# Patient Record
Sex: Male | Born: 1945 | Race: White | Hispanic: No | Marital: Single | Smoking: Former smoker
Health system: Southern US, Community
[De-identification: ages and names within clinical notes are randomized; demographics above are authoritative.]

## PROBLEM LIST (undated history)

## (undated) DIAGNOSIS — E7800 Pure hypercholesterolemia, unspecified: Secondary | ICD-10-CM

## (undated) DIAGNOSIS — I10 Essential (primary) hypertension: Secondary | ICD-10-CM

## (undated) DIAGNOSIS — E119 Type 2 diabetes mellitus without complications: Secondary | ICD-10-CM

## (undated) HISTORY — PX: OTHER SURGICAL HISTORY: SHX169

## (undated) HISTORY — PX: NASAL SINUS SURGERY: SHX719

## (undated) HISTORY — PX: TONSILLECTOMY: SUR1361

## (undated) HISTORY — PX: APPENDECTOMY: SHX54

## (undated) HISTORY — PX: NASAL SEPTUM SURGERY: SHX37

---

## 2017-12-22 ENCOUNTER — Encounter: Payer: Self-pay | Admitting: Gastroenterology

## 2018-01-19 ENCOUNTER — Ambulatory Visit (INDEPENDENT_AMBULATORY_CARE_PROVIDER_SITE_OTHER): Payer: Self-pay

## 2018-01-19 DIAGNOSIS — Z86010 Personal history of colonic polyps: Secondary | ICD-10-CM

## 2018-01-19 MED ORDER — NA SULFATE-K SULFATE-MG SULF 17.5-3.13-1.6 GM/177ML PO SOLN: 1.0000 | ORAL | 0 refills | Status: DC

## 2018-01-19 NOTE — Patient Instructions (Signed)
Henry [name]  Mar 17, 1946 MRN: [id number]     Procedure Date: 02/25/18 Time to register: 7:30am Place to register: [facility] Procedure Time: 8:30 am Scheduled provider: Barney Drain, MD    PREPARATION FOR COLONOSCOPY WITH SUPREP BOWEL PREP KIT  Note: Suprep Bowel Prep Kit is a split-dose (2day) regimen. Consumption of BOTH 6-ounce bottles is required for a complete prep.  Please notify us immediately if you are diabetic, take iron supplements, or if you are on Coumadin or any other blood thinners.  Please hold the following medications: I will mail a letter with this information                                                                                                                                                  1 DAY BEFORE PROCEDURE:  DATE: 02/24/18   DAY: Wednesday Continue clear liquids the entire day - NO SOLID FOOD.   Diabetic medications adjustments for today: see letter  At 6:00pm: Complete steps 1 through 4 below, using ONE (1) 6-ounce bottle, before going to bed. Step 1:  Pour ONE (1) 6-ounce bottle of SUPREP liquid into the mixing container.  Step 2:  Add cool drinking water to the 16 ounce line on the container and mix.  Note: Dilute the solution concentrate as directed prior to use. Step 3:  DRINK ALL the liquid in the container. Step 4:  You MUST drink an additional two (2) or more 16 ounce containers of water over the next one (1) hour.   Continue clear liquids only, until midnight. Do not eat or drink anything after midnight. EXCEPTION: If you take medications for your heart, blood pressure, or breathing, you may take these medications with a small amount of clear liquid.   DAY OF PROCEDURE:   DATE: 02/25/18   DAY: Thursday  Diabetic medications adjustments for today:see letter  5 hours before your procedure at 3:30am: Step 1:  Pour ONE (1) 6-ounce bottle of SUPREP liquid into the mixing container.  Step 2:  Add cool drinking water to the 16  ounce line on the container and mix.  Note: Dilute the solution concentrate as directed prior to use. Step 3:  DRINK ALL the liquid in the container. Step 4:  You MUST drink an additional two (2) or more 16 ounce containers of water over the next one (1) hour. You MUST complete the final glass of water at least 3 hours before your colonoscopy.   Nothing by mouth past 5:30am  You may take your morning medications with sip of water unless we have instructed otherwise.    Please see below for Dietary Information.  CLEAR LIQUIDS INCLUDE:  Water Jello (NOT red in color)   Ice Popsicles (NOT red in color)   Tea (sugar ok, no milk/cream) Powdered fruit flavored drinks  Coffee (sugar ok,  no milk/cream) Gatorade/ Lemonade/ Kool-Aid  (NOT red in color)   Juice: apple, white grape, white cranberry Soft drinks  Clear bullion, consomme, broth (fat free beef/chicken/vegetable)  Carbonated beverages (any kind)  Strained chicken noodle soup Hard Candy   Remember: Clear liquids are liquids that will allow you to see your fingers on the other side of a clear glass. Be sure liquids are NOT red in color, and not cloudy, but CLEAR.  DO NOT EAT OR DRINK ANY OF THE FOLLOWING:  Dairy products of any kind   Cranberry juice Tomato juice / V8 juice   Grapefruit juice Orange juice     Red grape juice  Do not eat any solid foods, including such foods as: cereal, oatmeal, yogurt, fruits, vegetables, creamed soups, eggs, bread, crackers, pureed foods in a blender, etc.   HELPFUL HINTS FOR DRINKING PREP SOLUTION:   Make sure prep is extremely cold. Mix and refrigerate the the morning of the prep. You may also put in the freezer.   You may try mixing some Crystal Light or Country Time Lemonade if you prefer. Mix in small amounts; add more if necessary.  Try drinking through a straw  Rinse mouth with water or a mouthwash between glasses, to remove after-taste.  Try sipping on a cold beverage /ice/ popsicles  between glasses of prep.  Place a piece of sugar-free hard candy in mouth between glasses.  If you become nauseated, try consuming smaller amounts, or stretch out the time between glasses. Stop for 30-60 minutes, then slowly start back drinking.     OTHER INSTRUCTIONS  You will need a responsible adult at least 72 years of age to accompany you and drive you home. This person must remain in the waiting room during your procedure. The hospital will cancel your procedure if you do not have a responsible adult with you.   1. Wear loose fitting clothing that is easily removed. 2. Leave jewelry and other valuables at home.  3. Remove all body piercing jewelry and leave at home. 4. Total time from sign-in until discharge is approximately 2-3 hours. 5. You should go home directly after your procedure and rest. You can resume normal activities the day after your procedure. 6. The day of your procedure you should not:  Drive  Make legal decisions  Operate machinery  Drink alcohol  Return to work   You may call the office (Dept: [phone]) before 5:00pm, or page the doctor on call [phone]) after 5:00pm, for further instructions, if necessary.   Insurance Information YOU WILL NEED TO CHECK WITH YOUR INSURANCE COMPANY FOR THE BENEFITS OF COVERAGE YOU HAVE FOR THIS PROCEDURE.  UNFORTUNATELY, NOT ALL INSURANCE COMPANIES HAVE BENEFITS TO COVER ALL OR PART OF THESE TYPES OF PROCEDURES.  IT IS YOUR RESPONSIBILITY TO CHECK YOUR BENEFITS, HOWEVER, WE WILL BE GLAD TO ASSIST YOU WITH ANY CODES YOUR INSURANCE COMPANY MAY NEED.    PLEASE NOTE THAT MOST INSURANCE COMPANIES WILL NOT COVER A SCREENING COLONOSCOPY FOR PEOPLE UNDER THE AGE OF 50  IF YOU HAVE BCBS INSURANCE, YOU MAY HAVE BENEFITS FOR A SCREENING COLONOSCOPY BUT IF POLYPS ARE FOUND THE DIAGNOSIS WILL CHANGE AND THEN YOU MAY HAVE A DEDUCTIBLE THAT WILL NEED TO BE MET. SO PLEASE MAKE SURE YOU CHECK YOUR BENEFITS FOR A SCREENING  COLONOSCOPY AS WELL AS A DIAGNOSTIC COLONOSCOPY.

## 2018-01-19 NOTE — Progress Notes (Signed)
Gastroenterology Pre-Procedure Review  Request Date:01/19/18 Requesting Physician: Inetta Fermo ( previous tcs 2016 in [place], pt stated he had polyps, Manuela Schwartz will get copy of tcs and path)  PATIENT REVIEW QUESTIONS: The patient responded to the following health history questions as indicated:    1. Diabetes Melitis: yes (glipizide and metformin) 2. Joint replacements in the past 12 months: no 3. Major health problems in the past 3 months: no 4. Has an artificial valve or MVP: no 5. Has a defibrillator: no 6. Has been advised in past to take antibiotics in advance of a procedure like teeth cleaning: no 7. Family history of colon cancer: no  8. Alcohol Use: yes (2-3 beers, 2 times a week ) 9. History of sleep apnea: no  10. History of coronary artery or other vascular stents placed within the last 12 months: no 11. History of any prior anesthesia complications: no    MEDICATIONS & ALLERGIES:    Patient reports the following regarding taking any blood thinners:   Plavix? no Aspirin? no Coumadin? no Brilinta? no Xarelto? no Eliquis? no Pradaxa? no Savaysa? no Effient? no  Patient confirms/reports the following medications:  Current Outpatient Medications  Medication Sig Dispense Refill  . amLODipine-valsartan (EXFORGE) 10-320 MG tablet amlod/valsar tab 10-320mg     . cetirizine (ZYRTEC) 10 MG tablet Take 10 mg by mouth daily.    Marland Kitchen glipiZIDE (GLUCOTROL XL) 10 MG 24 hr tablet glipizide er 10 mg tb24    . hydrochlorothiazide (MICROZIDE) 12.5 MG capsule Take 12.5 mg by mouth daily.    Marland Kitchen levothyroxine (SYNTHROID, LEVOTHROID) 75 MCG tablet levothyroxine sodium 75 mcg tabs    . metFORMIN (GLUCOPHAGE) 500 MG tablet metformin hcl 500 mg tabs    . minocycline (MINOCIN,DYNACIN) 50 MG capsule minocycline hcl 50 mg caps    . pioglitazone (ACTOS) 15 MG tablet pioglitazone hcl 15 mg tabs    . simvastatin (ZOCOR) 40 MG tablet simvastatin 40 mg tabs     No current facility-administered  medications for this visit.     Patient confirms/reports the following allergies:  No Known Allergies  No orders of the defined types were placed in this encounter.   AUTHORIZATION INFORMATION Primary Insurance:[employer] medicare ,  ID #:[id number] Pre-Cert / Auth required: no   SCHEDULE INFORMATION: Procedure has been scheduled as follows:  Date: 02/25/18, Time: 8:30 Location: [facility]  This Gastroenterology Pre-Precedure Review Form is being routed to the following provider(s): Neil Crouch, PA

## 2018-01-22 NOTE — Progress Notes (Signed)
Was patient told to have another in 3 years?

## 2018-01-22 NOTE — Progress Notes (Signed)
Pt said he was told to have another tcs in 3 years.

## 2018-01-26 NOTE — Progress Notes (Signed)
OK to schedule.  Day before TCS: glipizide 1/2 tablet daily, metformin 1/2 tablet bid, actos 1/2 tablet daily Am of TCS: hold glipizide, metformin, actos

## 2018-01-27 NOTE — Progress Notes (Signed)
Letter mailed to the pt with DM instructions 

## 2018-02-22 ENCOUNTER — Telehealth: Payer: Self-pay | Admitting: *Deleted

## 2018-02-22 NOTE — Telephone Encounter (Signed)
LMOVM

## 2018-02-22 NOTE — Telephone Encounter (Signed)
Procedure time for 02/25/18 is now at 10:00am, arrive at 9:00am. Day of procedure 02/25/18: 5 hours prior now changes to 5:00am to drink 2nd half of prep, npo 7:00am.  LMOVM

## 2018-02-23 NOTE — Telephone Encounter (Signed)
Patient aware of the below. He reports he called yesterday and someone had informed him of the information already

## 2018-02-25 ENCOUNTER — Ambulatory Visit (HOSPITAL_COMMUNITY)
Admission: RE | Admit: 2018-02-25 | Discharge: 2018-02-25 | Disposition: A | Discharge status: Home / Self Care | Payer: Medicare Other | Source: Ambulatory Visit | Attending: Gastroenterology | Admitting: Gastroenterology

## 2018-02-25 ENCOUNTER — Other Ambulatory Visit: Payer: Self-pay

## 2018-02-25 ENCOUNTER — Encounter (HOSPITAL_COMMUNITY): Payer: Self-pay | Admitting: *Deleted

## 2018-02-25 ENCOUNTER — Encounter (HOSPITAL_COMMUNITY)
Admission: RE | Disposition: A | Discharge status: Home / Self Care | Payer: Self-pay | Source: Ambulatory Visit | Attending: Gastroenterology

## 2018-02-25 DIAGNOSIS — D122 Benign neoplasm of ascending colon: Secondary | ICD-10-CM | POA: Diagnosis not present

## 2018-02-25 DIAGNOSIS — I10 Essential (primary) hypertension: Secondary | ICD-10-CM | POA: Insufficient documentation

## 2018-02-25 DIAGNOSIS — Z87891 Personal history of nicotine dependence: Secondary | ICD-10-CM | POA: Insufficient documentation

## 2018-02-25 DIAGNOSIS — Z1211 Encounter for screening for malignant neoplasm of colon: Secondary | ICD-10-CM | POA: Diagnosis not present

## 2018-02-25 DIAGNOSIS — D123 Benign neoplasm of transverse colon: Secondary | ICD-10-CM | POA: Diagnosis not present

## 2018-02-25 DIAGNOSIS — E119 Type 2 diabetes mellitus without complications: Secondary | ICD-10-CM | POA: Diagnosis not present

## 2018-02-25 DIAGNOSIS — K644 Residual hemorrhoidal skin tags: Secondary | ICD-10-CM | POA: Diagnosis not present

## 2018-02-25 DIAGNOSIS — Z79890 Hormone replacement therapy (postmenopausal): Secondary | ICD-10-CM | POA: Insufficient documentation

## 2018-02-25 DIAGNOSIS — Z7984 Long term (current) use of oral hypoglycemic drugs: Secondary | ICD-10-CM | POA: Insufficient documentation

## 2018-02-25 DIAGNOSIS — Z79899 Other long term (current) drug therapy: Secondary | ICD-10-CM | POA: Diagnosis not present

## 2018-02-25 DIAGNOSIS — K648 Other hemorrhoids: Secondary | ICD-10-CM | POA: Insufficient documentation

## 2018-02-25 DIAGNOSIS — E7800 Pure hypercholesterolemia, unspecified: Secondary | ICD-10-CM | POA: Diagnosis not present

## 2018-02-25 DIAGNOSIS — Z860100 Personal history of colon polyps, unspecified: Secondary | ICD-10-CM

## 2018-02-25 DIAGNOSIS — K5730 Diverticulosis of large intestine without perforation or abscess without bleeding: Secondary | ICD-10-CM | POA: Insufficient documentation

## 2018-02-25 DIAGNOSIS — D124 Benign neoplasm of descending colon: Secondary | ICD-10-CM | POA: Insufficient documentation

## 2018-02-25 HISTORY — DX: Essential (primary) hypertension: I10

## 2018-02-25 HISTORY — PX: POLYPECTOMY: SHX5525

## 2018-02-25 HISTORY — PX: COLONOSCOPY: SHX5424

## 2018-02-25 HISTORY — DX: Pure hypercholesterolemia, unspecified: E78.00

## 2018-02-25 HISTORY — DX: Type 2 diabetes mellitus without complications: E11.9

## 2018-02-25 LAB — GLUCOSE, CAPILLARY: GLUCOSE-CAPILLARY: 118 mg/dL — AB (ref 65–99)

## 2018-02-25 SURGERY — COLONOSCOPY
Anesthesia: Moderate Sedation

## 2018-02-25 MED ORDER — MIDAZOLAM HCL 5 MG/5ML IJ SOLN
INTRAMUSCULAR | Status: AC
  Filled 2018-02-25: qty 10

## 2018-02-25 MED ORDER — STERILE WATER FOR IRRIGATION IR SOLN
Status: DC | PRN
  Administered 2018-02-25: 11:00:00

## 2018-02-25 MED ORDER — SODIUM CHLORIDE 0.9 % IV SOLN
INTRAVENOUS | Status: DC
  Administered 2018-02-25: 09:00:00 via INTRAVENOUS

## 2018-02-25 MED ORDER — MEPERIDINE HCL 100 MG/ML IJ SOLN
INTRAMUSCULAR | Status: DC | PRN
  Administered 2018-02-25: 25 mg via INTRAVENOUS
  Administered 2018-02-25: 50 mg via INTRAVENOUS

## 2018-02-25 MED ORDER — MIDAZOLAM HCL 5 MG/5ML IJ SOLN
INTRAMUSCULAR | Status: DC | PRN
  Administered 2018-02-25: 1 mg via INTRAVENOUS
  Administered 2018-02-25 (×2): 2 mg via INTRAVENOUS

## 2018-02-25 MED ORDER — MEPERIDINE HCL 100 MG/ML IJ SOLN
INTRAMUSCULAR | Status: AC
  Filled 2018-02-25: qty 2

## 2018-02-25 NOTE — Op Note (Signed)
[facility] Patient Name: Henry [name] Procedure Date: 02/25/2018 8:49 AM MRN: [id number] Date of Birth: 20-Oct-1945 Attending MD: Barney Drain MD, MD CSN: [id number] Age: 72 Admit Type: Ambulatory Procedure:                Colonoscopy WITH COLD SNARE POLYPECTOMY Indications:              Personal history of colonic polyps Providers:                Barney Drain MD, MD, Lurline Del, RN, Randa Spike, Technician Referring MD:             Edwinna Areola. [place] Crane MD Medicines:                Meperidine 75 mg IV, Midazolam 5 mg IV Complications:            No immediate complications. Estimated Blood Loss:     Estimated blood loss was minimal. Procedure:                Pre-Anesthesia Assessment:                           - Prior to the procedure, a History and Physical                            was performed, and patient medications and                            allergies were reviewed. The patient's tolerance of                            previous anesthesia was also reviewed. The risks                            and benefits of the procedure and the sedation                            options and risks were discussed with the patient.                            All questions were answered, and informed consent                            was obtained. Prior Anticoagulants: The patient has                            taken no previous anticoagulant or antiplatelet                            agents. ASA Grade Assessment: II - A patient with                            mild systemic disease. After reviewing the risks  and benefits, the patient was deemed in                            satisfactory condition to undergo the procedure.                            After obtaining informed consent, the colonoscope                            was passed under direct vision. Throughout the                            procedure, the patient's blood pressure,  pulse, and                            oxygen saturations were monitored continuously. The                            EC-[id number] ([id number]) scope was introduced through                            the anus and advanced to the the cecum, identified                            by appendiceal orifice and ileocecal valve. The                            colonoscopy was somewhat difficult due to a                            tortuous colon. Successful completion of the                            procedure was aided by straightening and shortening                            the scope to obtain bowel loop reduction and                            COLOWRAP. The patient tolerated the procedure well.                            The quality of the bowel preparation was excellent.                            The ileocecal valve, appendiceal orifice, and                            rectum were photographed. Scope In: 11:14:18 AM Scope Out: 11:31:03 AM Scope Withdrawal Time: 0 hours 14 minutes 15 seconds  Total Procedure Duration: 0 hours 16 minutes 45 seconds  Findings:      Six sessile polyps were found in the descending colon, hepatic flexure       and ascending colon.  The polyps were 3 to 6 mm in size. These polyps       were removed with a cold snare. Resection and retrieval were complete.      Multiple small and large-mouthed diverticula were found in the       recto-sigmoid colon and sigmoid colon.      External hemorrhoids were found. The hemorrhoids were moderate.      Internal hemorrhoids were found. The hemorrhoids were small.      The recto-sigmoid colon and sigmoid colon were mildly redundant. Impression:               - Six 3 to 6 mm polyps in the descending colon, at                            the hepatic flexure and in the ascending colon,                            removed with a cold snare. Resected and retrieved.                           - MODERATE Diverticulosis in the recto-sigmoid                             colon and in the sigmoid colon.                           - External hemorrhoids.                           - Internal hemorrhoids.                           - Redundant LEFT colon. Moderate Sedation:      Moderate (conscious) sedation was administered by the endoscopy nurse       and supervised by the endoscopist. The following parameters were       monitored: oxygen saturation, heart rate, blood pressure, and response       to care. Total physician intraservice time was 31 minutes. Recommendation:           - High fiber diet.                           - Continue present medications.                           - Await pathology results.                           - Repeat colonoscopy in 3 years for surveillance.                           - Patient has a contact number available for                            emergencies. The signs and symptoms of potential  delayed complications were discussed with the                            patient. Return to normal activities tomorrow.                            Written discharge instructions were provided to the                            patient. Procedure Code(s):        --- Professional ---                           [phone], Colonoscopy, flexible; with removal of                            tumor(s), polyp(s), or other lesion(s) by snare                            technique                           G0500, Moderate sedation services provided by the                            same physician or other qualified health care                            professional performing a gastrointestinal                            endoscopic service that sedation supports,                            requiring the presence of an independent trained                            observer to assist in the monitoring of the                            patient's level of consciousness and physiological                             status; initial 15 minutes of intra-service time;                            patient age 102 years or older (additional time may                            be reported with [phone], as appropriate)                           [id number], Moderate sedation services provided by the  same physician or other qualified health care                            professional performing the diagnostic or                            therapeutic service that the sedation supports,                            requiring the presence of an independent trained                            observer to assist in the monitoring of the                            patient's level of consciousness and physiological                            status; each additional 15 minutes intraservice                            time (List separately in addition to code for                            primary service) Diagnosis Code(s):        --- Professional ---                           D12.4, Benign neoplasm of descending colon                           D12.3, Benign neoplasm of transverse colon (hepatic                            flexure or splenic flexure)                           D12.2, Benign neoplasm of ascending colon                           K64.4, Residual hemorrhoidal skin tags                           K64.8, Other hemorrhoids                           Z86.010, Personal history of colonic polyps                           K57.30, Diverticulosis of large intestine without                            perforation or abscess without bleeding                           Q43.8, Other specified congenital malformations of  intestine CPT copyright 2017 American Medical Association. All rights reserved. The codes documented in this report are preliminary and upon coder review may  be revised to meet current compliance requirements. Barney Drain, MD Barney Drain MD, MD 02/25/2018  11:50:18 AM This report has been signed electronically. Number of Addenda: 0

## 2018-02-25 NOTE — H&P (Signed)
Primary Care Physician:  Celene Squibb, MD Primary Gastroenterologist:  Dr. Oneida Alar  Pre-Procedure History & Physical: HPI:  Henry [name] is a 72 y.o. male here for  [place], [place].  Past Medical History:  Diagnosis Date  . Diabetes mellitus without complication ([place])   . Hypercholesteremia   . Hypertension     Past Surgical History:  Procedure Laterality Date  . APPENDECTOMY    . Left leg surgery    . NASAL SEPTUM SURGERY    . NASAL SINUS SURGERY    . TONSILLECTOMY      Prior to Admission medications   Medication Sig Start Date End Date Taking? Authorizing Provider  cetirizine (ZYRTEC) 10 MG tablet Take 10 mg by mouth daily as needed for allergies.    Yes [provider]  glipiZIDE (GLUCOTROL XL) 10 MG 24 hr tablet Take 10 mg by mouth daily with breakfast.   Yes [provider]  hydrochlorothiazide (MICROZIDE) 12.5 MG capsule Take 12.5 mg by mouth daily.   Yes [provider]  levothyroxine (SYNTHROID, LEVOTHROID) 75 MCG tablet Take 75 mcg by mouth daily before breakfast.   Yes [provider]  metFORMIN (GLUCOPHAGE) 500 MG tablet Take 542m by mouth twice daily   Yes [provider]  minocycline (MINOCIN,DYNACIN) 50 MG capsule Take 576mby mouth daily   Yes [provider]  Multiple Vitamins-Minerals (CENTRUM ADULTS PO) Take 1 tablet by mouth daily.   Yes [provider]  Na Sulfate-K Sulfate-Mg Sulf (SUPREP BOWEL PREP KIT) 17.5-3.13-1.6 GM/177ML SOLN Take 1 kit by mouth as directed. 01/19/18  Yes LeMahala MenghiniPA-C  naproxen sodium (ALEVE) 220 MG tablet Take 440 mg by mouth daily as needed (pain).   Yes [provider]  Omega-3 Fatty Acids (FISH OIL) 1200 MG CAPS Take 2,400 mg by mouth daily.   Yes [provider]  pioglitazone (ACTOS) 15 MG tablet Take 1541my mouth daily   Yes [provider]  simvastatin (ZOCOR) 40 MG tablet Take 33m5m mouth daily    Yes [provider]  telmisartan (MICARDIS) 40 MG tablet Take 40 mg by mouth daily.   Yes [provider]    Allergies as of 01/19/2018  . (No Known Allergies)    Family History  Problem Relation Age of Onset  . Colon cancer Neg Hx   . Colon polyps Neg Hx     Social History   Socioeconomic History  . Marital status: Single    Spouse name: Not on file  . Number of children: Not on file  . Years of education: Not on file  . Highest education level: Not on file  Occupational History  . Not on file  Social Needs  . Financial resource strain: Not on file  . Food insecurity:    Worry: Not on file    Inability: Not on file  . Transportation needs:    Medical: Not on file    Non-medical: Not on file  Tobacco Use  . Smoking status: Former Smoker    Packs/day: 0.25    Years: 4.00    Pack years: 1.00  . Smokeless tobacco: Never Used  Substance and Sexual Activity  . Alcohol use: Yes    Alcohol/week: 7.2 oz    Types: 12 Cans of beer per week  . Drug use: Never  . Sexual activity: Not on file  Lifestyle  . Physical activity:    Days per week: Not on  file    Minutes per session: Not on file  . Stress: Not on file  Relationships  . Social connections:    Talks on phone: Not on file    Gets together: Not on file    Attends religious service: Not on file    Active member of club or organization: Not on file    Attends meetings of clubs or organizations: Not on file    Relationship status: Not on file  . Intimate partner violence:    Fear of current or ex partner: Not on file    Emotionally abused: Not on file    Physically abused: Not on file    Forced sexual activity: Not on file  Other Topics Concern  . Not on file  Social History Narrative  . Not on file    Review of Systems: See HPI, otherwise negative ROS   Physical Exam: BP (!) 164/60   Pulse (!) 55   Temp 97.7 F (36.5 C) (Oral)   Resp 14   Ht 5' 4"  (1.626 m)   Wt 215 lb (97.5 kg)    SpO2 100%   BMI 36.90 kg/m  General:   Alert,  pleasant and cooperative in NAD Head:  Normocephalic and atraumatic. Neck:  Supple; Lungs:  Clear throughout to auscultation.    Heart:  Regular rate and rhythm. Abdomen:  Soft, nontender and nondistended. Normal bowel sounds, without guarding, and without rebound.   Neurologic:  Alert and  oriented x4;  grossly normal neurologically.  Impression/Plan:     PERSONAL HISTORY OF POLYPS.  Plan:  1. TCS TODAY DISCUSSED PROCEDURE, BENEFITS, & RISKS: < 1% chance of medication reaction, bleeding, perforation, or rupture of spleen/liver.

## 2018-02-25 NOTE — Discharge Instructions (Signed)
You have small internal hemorrhoids and diverticulosis IN YOUR LEFT COLON. YOU HAD SIX POLYPS REMOVED.    DRINK WATER TO KEEP YOUR URINE LIGHT YELLOW.  CONTINUE YOUR WEIGHT LOSS EFFORTS. YOUR BODY MASS INDEX IS OVER 30 WHICH MEANS YOU ARE OBESE. OBESITY IS ASSOCIATED WITH AN INCREASED FOR CIRRHOSIS AND ALL CANCERS, INCLUDING ESOPHAGEAL AND COLON CANCER. A WEIGHT OF 173 LBS OR LESS   WILL GET YOUR BODY MASS INDEX(BMI) UNDER 30.  FOLLOW A HIGH FIBER DIET. AVOID ITEMS THAT CAUSE BLOATING. See info below.  YOUR BIOPSY RESULTS WILL BE AVAILABLE IN 7 DAYS.   USE PREPARATION H FOUR TIMES  A DAY IF NEEDED TO RELIEVE RECTAL PAIN/PRESSURE/BLEEDING.  Next colonoscopy in 3 years.  Colonoscopy Care After Read the instructions outlined below and refer to this sheet in the next week. These discharge instructions provide you with general information on caring for yourself after you leave the hospital. While your treatment has been planned according to the most current medical practices available, unavoidable complications occasionally occur. If you have any problems or questions after discharge, call DR. Emerick Weatherly, [phone].  ACTIVITY  You may resume your regular activity, but move at a slower pace for the next 24 hours.   Take frequent rest periods for the next 24 hours.   Walking will help get rid of the air and reduce the bloated feeling in your belly (abdomen).   No driving for 24 hours (because of the medicine (anesthesia) used during the test).   You may shower.   Do not sign any important legal documents or operate any machinery for 24 hours (because of the anesthesia used during the test).    NUTRITION  Drink plenty of fluids.   You may resume your normal diet as instructed by your doctor.   Begin with a light meal and progress to your normal diet. Heavy or fried foods are harder to digest and may make you feel sick to your stomach (nauseated).   Avoid alcoholic beverages for 24  hours or as instructed.    MEDICATIONS  You may resume your normal medications.   WHAT YOU CAN EXPECT TODAY  Some feelings of bloating in the abdomen.   Passage of more gas than usual.   Spotting of blood in your stool or on the toilet paper  .  IF YOU HAD POLYPS REMOVED DURING THE COLONOSCOPY:  Eat a soft diet IF YOU HAVE NAUSEA, BLOATING, ABDOMINAL PAIN, OR VOMITING.    FINDING OUT THE RESULTS OF YOUR TEST Not all test results are available during your visit. DR. Oneida Alar WILL CALL YOU WITHIN 14 DAYS OF YOUR PROCEDUE WITH YOUR RESULTS. Do not assume everything is normal if you have not heard from DR. Pranika Finks, CALL HER OFFICE AT [phone].  SEEK IMMEDIATE MEDICAL ATTENTION AND CALL THE OFFICE: [phone] IF:  You have more than a spotting of blood in your stool.   Your belly is swollen (abdominal distention).   You are nauseated or vomiting.   You have a temperature over 101F.   You have abdominal pain or discomfort that is severe or gets worse throughout the day.  High-Fiber Diet A high-fiber diet changes your normal diet to include more whole grains, legumes, fruits, and vegetables. Changes in the diet involve replacing refined carbohydrates with unrefined foods. The calorie level of the diet is essentially unchanged. The Dietary Reference Intake (recommended amount) for adult males is 38 grams per day. For adult females, it is 25 grams per day. Pregnant  and lactating women should consume 28 grams of fiber per day. Fiber is the intact part of a plant that is not broken down during digestion. Functional fiber is fiber that has been isolated from the plant to provide a beneficial effect in the body.  PURPOSE  Increase stool bulk.   Ease and regulate bowel movements.   Lower cholesterol.   REDUCE RISK OF COLON CANCER  INDICATIONS THAT YOU NEED MORE FIBER  Constipation and hemorrhoids.   Uncomplicated diverticulosis (intestine condition) and irritable bowel  syndrome.   Weight management.   As a protective measure against hardening of the arteries (atherosclerosis), diabetes, and cancer.   GUIDELINES FOR INCREASING FIBER IN THE DIET  Start adding fiber to the diet slowly. A gradual increase of about 5 more grams (2 slices of whole-wheat bread, 2 servings of most fruits or vegetables, or 1 bowl of high-fiber cereal) per day is best. Too rapid an increase in fiber may result in constipation, flatulence, and bloating.   Drink enough water and fluids to keep your urine clear or pale yellow. Water, juice, or caffeine-free drinks are recommended. Not drinking enough fluid may cause constipation.   Eat a variety of high-fiber foods rather than one type of fiber.   Try to increase your intake of fiber through using high-fiber foods rather than fiber pills or supplements that contain small amounts of fiber.   The goal is to change the types of food eaten. Do not supplement your present diet with high-fiber foods, but replace foods in your present diet.   INCLUDE A VARIETY OF FIBER SOURCES  Replace refined and processed grains with whole grains, canned fruits with fresh fruits, and incorporate other fiber sources. White rice, white breads, and most bakery goods contain little or no fiber.   Brown whole-grain rice, buckwheat oats, and many fruits and vegetables are all good sources of fiber. These include: broccoli, Brussels sprouts, cabbage, cauliflower, beets, sweet potatoes, white potatoes (skin on), carrots, tomatoes, eggplant, squash, berries, fresh fruits, and dried fruits.   Cereals appear to be the richest source of fiber. Cereal fiber is found in whole grains and bran. Bran is the fiber-rich outer coat of cereal grain, which is largely removed in refining. In whole-grain cereals, the bran remains. In breakfast cereals, the largest amount of fiber is found in those with "bran" in their names. The fiber content is sometimes indicated on the label.     You may need to include additional fruits and vegetables each day.   In baking, for 1 cup white flour, you may use the following substitutions:   1 cup whole-wheat flour minus 2 tablespoons.   1/2 cup white flour plus 1/2 cup whole-wheat flour.   Polyps, Colon  A polyp is extra tissue that grows inside your body. Colon polyps grow in the large intestine. The large intestine, also called the colon, is part of your digestive system. It is a long, hollow tube at the end of your digestive tract where your body makes and stores stool. Most polyps are not dangerous. They are benign. This means they are not cancerous. But over time, some types of polyps can turn into cancer. Polyps that are smaller than a pea are usually not harmful. But larger polyps could someday become or may already be cancerous. To be safe, doctors remove all polyps and test them.   PREVENTION There is not one sure way to prevent polyps. You might be able to lower your risk of getting them  if you:  Eat more fruits and vegetables and less fatty food.   Do not smoke.   Avoid alcohol.   Exercise every day.   Lose weight if you are overweight.   Eating more calcium and folate can also lower your risk of getting polyps. Some foods that are rich in calcium are milk, cheese, and broccoli. Some foods that are rich in folate are chickpeas, kidney beans, and spinach.    Diverticulosis Diverticulosis is a common condition that develops when small pouches (diverticula) form in the wall of the colon. The risk of diverticulosis increases with age. It happens more often in people who eat a low-fiber diet. Most individuals with diverticulosis have no symptoms. Those individuals with symptoms usually experience belly (abdominal) pain, constipation, or loose stools (diarrhea).  HOME CARE INSTRUCTIONS  Increase the amount of fiber in your diet as directed by your caregiver or dietician. This may reduce symptoms of diverticulosis.    Drink at least 6 to 8 glasses of water each day to prevent constipation.   Try not to strain when you have a bowel movement.   Avoiding nuts and seeds to prevent complications is NOT NECESSARY.   FOODS HAVING HIGH FIBER CONTENT INCLUDE:  Fruits. Apple, peach, pear, tangerine, raisins, prunes.   Vegetables. Brussels sprouts, asparagus, broccoli, cabbage, carrot, cauliflower, romaine lettuce, spinach, summer squash, tomato, winter squash, zucchini.   Starchy Vegetables. Baked beans, kidney beans, lima beans, split peas, lentils, potatoes (with skin).   Grains. Whole wheat bread, brown rice, bran flake cereal, plain oatmeal, white rice, shredded wheat, bran muffins.   SEEK IMMEDIATE MEDICAL CARE IF:  You develop increasing pain or severe bloating.   You have an oral temperature above 101F.   You develop vomiting or bowel movements that are bloody or black.   Hemorrhoids Hemorrhoids are dilated (enlarged) veins around the rectum. Sometimes clots will form in the veins. This makes them swollen and painful. These are called thrombosed hemorrhoids. Causes of hemorrhoids include:  Constipation.   Straining to have a bowel movement.   HEAVY LIFTING   HOME CARE INSTRUCTIONS  Eat a well balanced diet and drink 6 to 8 glasses of water every day to avoid constipation. You may also use a bulk laxative.   Avoid straining to have bowel movements.   Keep anal area dry and clean.   Do not use a donut shaped pillow or sit on the toilet for long periods. This increases blood pooling and pain.   Move your bowels when your body has the urge; this will require less straining and will decrease pain and pressure.

## 2018-03-04 ENCOUNTER — Encounter (HOSPITAL_COMMUNITY): Payer: Self-pay | Admitting: Gastroenterology

## 2018-03-04 NOTE — Progress Notes (Signed)
LMOM to call.

## 2018-03-05 NOTE — Progress Notes (Signed)
CC'D TO PCP °

## 2018-03-11 NOTE — Progress Notes (Signed)
Pt is aware.  

## 2019-11-15 DIAGNOSIS — E039 Hypothyroidism, unspecified: Secondary | ICD-10-CM | POA: Diagnosis not present

## 2019-11-15 DIAGNOSIS — Z6839 Body mass index (BMI) 39.0-39.9, adult: Secondary | ICD-10-CM | POA: Diagnosis not present

## 2019-11-15 DIAGNOSIS — Z6841 Body Mass Index (BMI) 40.0 and over, adult: Secondary | ICD-10-CM | POA: Diagnosis not present

## 2019-11-15 DIAGNOSIS — E1165 Type 2 diabetes mellitus with hyperglycemia: Secondary | ICD-10-CM | POA: Diagnosis not present

## 2019-11-15 DIAGNOSIS — Z0000 Encounter for general adult medical examination without abnormal findings: Secondary | ICD-10-CM | POA: Diagnosis not present

## 2019-11-15 DIAGNOSIS — J060 Acute laryngopharyngitis: Secondary | ICD-10-CM | POA: Diagnosis not present

## 2019-11-15 DIAGNOSIS — E782 Mixed hyperlipidemia: Secondary | ICD-10-CM | POA: Diagnosis not present

## 2019-11-15 DIAGNOSIS — J302 Other seasonal allergic rhinitis: Secondary | ICD-10-CM | POA: Diagnosis not present

## 2019-11-15 DIAGNOSIS — I10 Essential (primary) hypertension: Secondary | ICD-10-CM | POA: Diagnosis not present

## 2019-11-17 DIAGNOSIS — Z0001 Encounter for general adult medical examination with abnormal findings: Secondary | ICD-10-CM | POA: Diagnosis not present

## 2019-11-17 DIAGNOSIS — E871 Hypo-osmolality and hyponatremia: Secondary | ICD-10-CM | POA: Diagnosis not present

## 2019-11-17 DIAGNOSIS — I10 Essential (primary) hypertension: Secondary | ICD-10-CM | POA: Diagnosis not present

## 2019-11-17 DIAGNOSIS — E782 Mixed hyperlipidemia: Secondary | ICD-10-CM | POA: Diagnosis not present

## 2019-11-17 DIAGNOSIS — E039 Hypothyroidism, unspecified: Secondary | ICD-10-CM | POA: Diagnosis not present

## 2019-11-17 DIAGNOSIS — E1169 Type 2 diabetes mellitus with other specified complication: Secondary | ICD-10-CM | POA: Diagnosis not present

## 2019-11-17 DIAGNOSIS — E875 Hyperkalemia: Secondary | ICD-10-CM | POA: Diagnosis not present

## 2019-11-17 DIAGNOSIS — J302 Other seasonal allergic rhinitis: Secondary | ICD-10-CM | POA: Diagnosis not present

## 2020-03-12 DIAGNOSIS — E039 Hypothyroidism, unspecified: Secondary | ICD-10-CM | POA: Diagnosis not present

## 2020-03-12 DIAGNOSIS — E1165 Type 2 diabetes mellitus with hyperglycemia: Secondary | ICD-10-CM | POA: Diagnosis not present

## 2020-03-12 DIAGNOSIS — Z0001 Encounter for general adult medical examination with abnormal findings: Secondary | ICD-10-CM | POA: Diagnosis not present

## 2020-03-12 DIAGNOSIS — Z0000 Encounter for general adult medical examination without abnormal findings: Secondary | ICD-10-CM | POA: Diagnosis not present

## 2020-03-12 DIAGNOSIS — E1169 Type 2 diabetes mellitus with other specified complication: Secondary | ICD-10-CM | POA: Diagnosis not present

## 2020-03-12 DIAGNOSIS — D649 Anemia, unspecified: Secondary | ICD-10-CM | POA: Diagnosis not present

## 2020-03-12 DIAGNOSIS — E782 Mixed hyperlipidemia: Secondary | ICD-10-CM | POA: Diagnosis not present

## 2020-03-15 DIAGNOSIS — E1169 Type 2 diabetes mellitus with other specified complication: Secondary | ICD-10-CM | POA: Diagnosis not present

## 2020-03-15 DIAGNOSIS — Z0001 Encounter for general adult medical examination with abnormal findings: Secondary | ICD-10-CM | POA: Diagnosis not present

## 2020-03-15 DIAGNOSIS — E782 Mixed hyperlipidemia: Secondary | ICD-10-CM | POA: Diagnosis not present

## 2020-03-15 DIAGNOSIS — E875 Hyperkalemia: Secondary | ICD-10-CM | POA: Diagnosis not present

## 2020-03-15 DIAGNOSIS — E039 Hypothyroidism, unspecified: Secondary | ICD-10-CM | POA: Diagnosis not present

## 2020-03-15 DIAGNOSIS — J302 Other seasonal allergic rhinitis: Secondary | ICD-10-CM | POA: Diagnosis not present

## 2020-03-15 DIAGNOSIS — I10 Essential (primary) hypertension: Secondary | ICD-10-CM | POA: Diagnosis not present

## 2020-03-15 DIAGNOSIS — E871 Hypo-osmolality and hyponatremia: Secondary | ICD-10-CM | POA: Diagnosis not present

## 2020-04-19 DIAGNOSIS — I10 Essential (primary) hypertension: Secondary | ICD-10-CM | POA: Diagnosis not present

## 2020-04-19 DIAGNOSIS — J302 Other seasonal allergic rhinitis: Secondary | ICD-10-CM | POA: Diagnosis not present

## 2020-04-19 DIAGNOSIS — E1165 Type 2 diabetes mellitus with hyperglycemia: Secondary | ICD-10-CM | POA: Diagnosis not present

## 2020-04-19 DIAGNOSIS — Z6839 Body mass index (BMI) 39.0-39.9, adult: Secondary | ICD-10-CM | POA: Diagnosis not present

## 2020-04-19 DIAGNOSIS — E039 Hypothyroidism, unspecified: Secondary | ICD-10-CM | POA: Diagnosis not present

## 2020-04-19 DIAGNOSIS — Z0000 Encounter for general adult medical examination without abnormal findings: Secondary | ICD-10-CM | POA: Diagnosis not present

## 2020-04-19 DIAGNOSIS — J060 Acute laryngopharyngitis: Secondary | ICD-10-CM | POA: Diagnosis not present

## 2020-04-19 DIAGNOSIS — E782 Mixed hyperlipidemia: Secondary | ICD-10-CM | POA: Diagnosis not present

## 2020-04-19 DIAGNOSIS — Z0001 Encounter for general adult medical examination with abnormal findings: Secondary | ICD-10-CM | POA: Diagnosis not present

## 2020-05-15 DIAGNOSIS — E113293 Type 2 diabetes mellitus with mild nonproliferative diabetic retinopathy without macular edema, bilateral: Secondary | ICD-10-CM | POA: Diagnosis not present

## 2020-06-28 ENCOUNTER — Telehealth: Payer: Self-pay | Admitting: Internal Medicine

## 2020-06-28 NOTE — Telephone Encounter (Signed)
Patient should consider OV since we have not seen him in over two years and we don't know his current medicines and history. Without evaluation we cannot be for sure of what is going on. May be hemorrhoid bleeding.   I would recommend: 1. OV to evaluate.  2. Monitor for further bleeding, if he notes significant blood, multiple more episodes, lightheaded/dizzy/weak then go to ER.  3. Can send in RX for hemorrhoids if he wants.

## 2020-06-28 NOTE — Telephone Encounter (Signed)
Pt called asking to speak with nurse. I told him the nurse was with patients and I could take a message. He is seeing blood in his stool. I offered to make him an appointment, but he refused. He wants to see what the nurse thinks first and what she could recommend. [phone]

## 2020-06-28 NOTE — Telephone Encounter (Signed)
Spoke with pt. Pt noticed some blood in the toilet this morning 06/28/20 after a BM. Pt states it was about a teaspoon amount, no rectal pain, no dizziness, no sob, no n/v and no lightheadedness. Pt was offered an apt when he called this morning and refused. Pt hasn't been seen in office but had a colonoscopy 2 years ago. Please advise.

## 2020-06-28 NOTE — Telephone Encounter (Signed)
Lmom, waiting on a return call.  

## 2020-06-29 MED ORDER — HYDROCORTISONE (PERIANAL) 2.5 % EX CREA: 1.0000 "application " | TOPICAL_CREAM | Freq: Two times a day (BID) | CUTANEOUS | 0 refills | Status: DC

## 2020-06-29 NOTE — Addendum Note (Signed)
Addended by: Mahala Menghini on: 06/29/2020 10:24 AM   Modules accepted: Orders

## 2020-06-29 NOTE — Telephone Encounter (Signed)
Pt made aware that RX has been sent. 

## 2020-06-29 NOTE — Telephone Encounter (Signed)
Pt called back.  Informed him of Neil Crouch, PA's recommendations.  Pt voiced understanding and made ov for 07/12/2020 at 2:30 with Walden Field, NP.  Pt would like [place] to send in RX for hemorrhoids to [facility] .

## 2020-06-29 NOTE — Telephone Encounter (Signed)
rx sent

## 2020-07-12 ENCOUNTER — Ambulatory Visit: Payer: Medicare Other | Admitting: Nurse Practitioner

## 2020-08-02 ENCOUNTER — Ambulatory Visit: Payer: Medicare PPO | Admitting: Internal Medicine

## 2020-08-02 ENCOUNTER — Encounter: Payer: Self-pay | Admitting: Internal Medicine

## 2020-08-02 ENCOUNTER — Other Ambulatory Visit: Payer: Self-pay

## 2020-08-02 VITALS — BP 160/72 | HR 66 | Temp 97.0°F | Ht 65.0 in | Wt 215.6 lb

## 2020-08-02 DIAGNOSIS — K5900 Constipation, unspecified: Secondary | ICD-10-CM

## 2020-08-02 DIAGNOSIS — K625 Hemorrhage of anus and rectum: Secondary | ICD-10-CM

## 2020-08-02 DIAGNOSIS — Z86010 Personal history of colonic polyps: Secondary | ICD-10-CM

## 2020-08-02 NOTE — Progress Notes (Signed)
Referring Provider: Celene Squibb, MD Primary Care Physician:  Celene Squibb, MD Primary GI:  Dr. Abbey Chatters  Chief Complaint  Patient presents with  . Blood In Stools    end of october. None now  . Diarrhea    late oct and started on soft diet. Much better now    HPI:   Henry [name] is a 74 y.o. male who presents to the clinic today for follow-up visit.  States last month he had an episode of rectal bleeding.  This is occurred after a "hard" bowel movement.  Notes rectal bleeding with bowel movements for the preceding 4 to 5 days.  This has stopped at present.  Does have issues with chronic constipation.  States he eats a lot of fiber in his diet.  Also states he drinks enough water every day.  Occasional lower abdominal discomfort which is relieved with bowel movements.  Last colonoscopy 2019 showed numerous polyps fiber which were tubular adenomas.  Recall 2022.  Past Medical History:  Diagnosis Date  . Diabetes mellitus without complication ([place])   . Hypercholesteremia   . Hypertension     Past Surgical History:  Procedure Laterality Date  . APPENDECTOMY    . COLONOSCOPY N/A 02/25/2018   Procedure: COLONOSCOPY;  Surgeon: Danie Binder, MD;  Location: [facility];  Service: Endoscopy;  Laterality: N/A;  8:30  . Left leg surgery    . NASAL SEPTUM SURGERY    . NASAL SINUS SURGERY    . POLYPECTOMY  02/25/2018   Procedure: POLYPECTOMY;  Surgeon: Danie Binder, MD;  Location: [facility];  Service: Endoscopy;;  ascending x2, descending x2, hepatic flexure polyps x2  . TONSILLECTOMY      Current Outpatient Medications  Medication Sig Dispense Refill  . cetirizine (ZYRTEC) 10 MG tablet Take 10 mg by mouth daily as needed for allergies.     Marland Kitchen glipiZIDE (GLUCOTROL XL) 10 MG 24 hr tablet Take 10 mg by mouth daily with breakfast.    . hydrochlorothiazide (MICROZIDE) 12.5 MG capsule Take 12.5 mg by mouth daily.    Marland Kitchen levothyroxine (SYNTHROID, LEVOTHROID) 75 MCG tablet Take 75 mcg by  mouth daily before breakfast.    . metFORMIN (GLUCOPHAGE) 500 MG tablet Take 500mg  by mouth twice daily    . Multiple Vitamins-Minerals (CENTRUM ADULTS PO) Take 1 tablet by mouth daily.    . naproxen sodium (ALEVE) 220 MG tablet Take 440 mg by mouth daily as needed (pain).    . Omega-3 Fatty Acids (FISH OIL) 1200 MG CAPS Take 2,400 mg by mouth daily.    . simvastatin (ZOCOR) 40 MG tablet Take 40mg  by mouth daily    . telmisartan (MICARDIS) 40 MG tablet Take 40 mg by mouth daily.     No current facility-administered medications for this visit.    Allergies as of 08/02/2020  . (No Known Allergies)    Family History  Problem Relation Age of Onset  . Colon cancer Neg Hx   . Colon polyps Neg Hx     Social History   Socioeconomic History  . Marital status: Single    Spouse name: Not on file  . Number of children: Not on file  . Years of education: Not on file  . Highest education level: Not on file  Occupational History  . Not on file  Tobacco Use  . Smoking status: Former Smoker    Packs/day: 0.25    Years: 4.00    Pack  years: 1.00  . Smokeless tobacco: Never Used  Vaping Use  . Vaping Use: Never used  Substance and Sexual Activity  . Alcohol use: Yes    Alcohol/week: 12.0 standard drinks    Types: 12 Cans of beer per week  . Drug use: Never  . Sexual activity: Not on file  Other Topics Concern  . Not on file  Social History Narrative  . Not on file   Social Determinants of Health   Financial Resource Strain:   . Difficulty of Paying Living Expenses: Not on file  Food Insecurity:   . Worried About Charity fundraiser in the Last Year: Not on file  . Ran Out of Food in the Last Year: Not on file  Transportation Needs:   . Lack of Transportation (Medical): Not on file  . Lack of Transportation (Non-Medical): Not on file  Physical Activity:   . Days of Exercise per Week: Not on file  . Minutes of Exercise per Session: Not on file  Stress:   . Feeling of Stress  : Not on file  Social Connections:   . Frequency of Communication with Friends and Family: Not on file  . Frequency of Social Gatherings with Friends and Family: Not on file  . Attends Religious Services: Not on file  . Active Member of Clubs or Organizations: Not on file  . Attends Archivist Meetings: Not on file  . Marital Status: Not on file    Subjective: Review of Systems  Constitutional: Negative for chills and fever.  HENT: Negative for congestion and hearing loss.   Eyes: Negative for blurred vision and double vision.  Respiratory: Negative for cough and shortness of breath.   Cardiovascular: Negative for chest pain and palpitations.  Gastrointestinal: Positive for constipation. Negative for abdominal pain, blood in stool, diarrhea, heartburn, melena and vomiting.  Genitourinary: Negative for dysuria and urgency.  Musculoskeletal: Negative for joint pain and myalgias.  Skin: Negative for itching and rash.  Neurological: Negative for dizziness and headaches.  Psychiatric/Behavioral: Negative for depression. The patient is not nervous/anxious.      Objective: BP (!) 160/72   Pulse 66   Temp (!) 97 F (36.1 C)   Ht 5\' 5"  (1.651 m)   Wt 215 lb 9.6 oz (97.8 kg)   BMI 35.88 kg/m  Physical Exam Constitutional:      Appearance: Normal appearance.  HENT:     Head: Normocephalic and atraumatic.  Eyes:     Extraocular Movements: Extraocular movements intact.     Conjunctiva/sclera: Conjunctivae normal.  Cardiovascular:     Rate and Rhythm: Normal rate and regular rhythm.  Pulmonary:     Effort: Pulmonary effort is normal.     Breath sounds: Normal breath sounds.  Abdominal:     General: Bowel sounds are normal.     Palpations: Abdomen is soft.  Musculoskeletal:        General: Normal range of motion.     Cervical back: Normal range of motion and neck supple.  Skin:    General: Skin is warm.  Neurological:     General: No focal deficit present.      Mental Status: He is alert and oriented to person, place, and time.  Psychiatric:        Mood and Affect: Mood normal.        Behavior: Behavior normal.      Assessment: *Constipation *Rectal bleeding-likely hemorrhoidal *History of adenomatous colon polyps  Plan: Patient's rectal  bleeding likely hemorrhoidal in the setting of chronic constipation.  States his bleeding has stopped at present.  Recommend that he add MiraLAX 1 capful daily to help with his constipation and soften bowel movements.  Also recommend that he take over-the-counter Benefiber or Metamucil ensure that he is drinking 4 to 6 glasses of water daily.  If this becomes a recurrent issue, we can discuss hemorrhoid banding.  Otherwise I will see him for surveillance colonoscopy June 2022.  08/02/2020 8:33 AM   Disclaimer: This note was dictated with voice recognition software. Similar sounding words can inadvertently be transcribed and may not be corrected upon review.

## 2020-08-02 NOTE — Patient Instructions (Signed)
Your rectal bleeding is likely due to hemorrhoids which were seen previously on colonoscopy.  I would recommend adding MiraLAX 1 capful in the morning to soften your bowel movements and help with constipation.  I would also recommend adding over-the-counter Metamucil or Benefiber.  Be sure to drink at least 4 to 6 glasses of water daily.  We will plan on colonoscopy in June 2022.  Otherwise follow-up with GI as needed.  At [facility] we value your feedback. You may receive a survey about your visit today. Please share your experience as we strive to create trusting relationships with our patients to provide genuine, compassionate, quality care.  We appreciate your understanding and patience as we review any laboratory studies, imaging, and other diagnostic tests that are ordered as we care for you. Our office policy is 5 business days for review of these results, and any emergent or urgent results are addressed in a timely manner for your best interest. If you do not hear from our office in 1 week, please contact us.   We also encourage the use of MyChart, which contains your medical information for your review as well. If you are not enrolled in this feature, an access code is on this after visit summary for your convenience. Thank you for allowing [place] to be involved in your care.  It was great to see you today!  I hope you have a great rest of your fall!!    Ardyth Kelso K. Abbey Chatters, D.O. Gastroenterology and Hepatology [facility]

## 2020-09-27 DIAGNOSIS — I10 Essential (primary) hypertension: Secondary | ICD-10-CM | POA: Diagnosis not present

## 2020-09-27 DIAGNOSIS — Z0001 Encounter for general adult medical examination with abnormal findings: Secondary | ICD-10-CM | POA: Diagnosis not present

## 2020-09-27 DIAGNOSIS — Z6839 Body mass index (BMI) 39.0-39.9, adult: Secondary | ICD-10-CM | POA: Diagnosis not present

## 2020-09-27 DIAGNOSIS — E039 Hypothyroidism, unspecified: Secondary | ICD-10-CM | POA: Diagnosis not present

## 2020-09-27 DIAGNOSIS — E782 Mixed hyperlipidemia: Secondary | ICD-10-CM | POA: Diagnosis not present

## 2020-09-27 DIAGNOSIS — J302 Other seasonal allergic rhinitis: Secondary | ICD-10-CM | POA: Diagnosis not present

## 2020-09-27 DIAGNOSIS — J060 Acute laryngopharyngitis: Secondary | ICD-10-CM | POA: Diagnosis not present

## 2020-09-27 DIAGNOSIS — E1165 Type 2 diabetes mellitus with hyperglycemia: Secondary | ICD-10-CM | POA: Diagnosis not present

## 2020-09-27 DIAGNOSIS — Z0000 Encounter for general adult medical examination without abnormal findings: Secondary | ICD-10-CM | POA: Diagnosis not present

## 2020-10-02 DIAGNOSIS — E782 Mixed hyperlipidemia: Secondary | ICD-10-CM | POA: Diagnosis not present

## 2020-10-02 DIAGNOSIS — J302 Other seasonal allergic rhinitis: Secondary | ICD-10-CM | POA: Diagnosis not present

## 2020-10-02 DIAGNOSIS — E039 Hypothyroidism, unspecified: Secondary | ICD-10-CM | POA: Diagnosis not present

## 2020-10-02 DIAGNOSIS — L719 Rosacea, unspecified: Secondary | ICD-10-CM | POA: Diagnosis not present

## 2020-10-02 DIAGNOSIS — E871 Hypo-osmolality and hyponatremia: Secondary | ICD-10-CM | POA: Diagnosis not present

## 2020-10-02 DIAGNOSIS — E875 Hyperkalemia: Secondary | ICD-10-CM | POA: Diagnosis not present

## 2020-10-02 DIAGNOSIS — E1169 Type 2 diabetes mellitus with other specified complication: Secondary | ICD-10-CM | POA: Diagnosis not present

## 2020-10-02 DIAGNOSIS — I10 Essential (primary) hypertension: Secondary | ICD-10-CM | POA: Diagnosis not present

## 2021-01-10 DIAGNOSIS — E1169 Type 2 diabetes mellitus with other specified complication: Secondary | ICD-10-CM | POA: Diagnosis not present

## 2021-01-10 DIAGNOSIS — E039 Hypothyroidism, unspecified: Secondary | ICD-10-CM | POA: Diagnosis not present

## 2021-01-10 DIAGNOSIS — E871 Hypo-osmolality and hyponatremia: Secondary | ICD-10-CM | POA: Diagnosis not present

## 2021-01-10 DIAGNOSIS — E1165 Type 2 diabetes mellitus with hyperglycemia: Secondary | ICD-10-CM | POA: Diagnosis not present

## 2021-01-10 DIAGNOSIS — E875 Hyperkalemia: Secondary | ICD-10-CM | POA: Diagnosis not present

## 2021-01-10 DIAGNOSIS — E782 Mixed hyperlipidemia: Secondary | ICD-10-CM | POA: Diagnosis not present

## 2021-01-10 DIAGNOSIS — I10 Essential (primary) hypertension: Secondary | ICD-10-CM | POA: Diagnosis not present

## 2021-01-14 DIAGNOSIS — E782 Mixed hyperlipidemia: Secondary | ICD-10-CM | POA: Diagnosis not present

## 2021-01-14 DIAGNOSIS — E875 Hyperkalemia: Secondary | ICD-10-CM | POA: Diagnosis not present

## 2021-01-14 DIAGNOSIS — I10 Essential (primary) hypertension: Secondary | ICD-10-CM | POA: Diagnosis not present

## 2021-01-14 DIAGNOSIS — E1169 Type 2 diabetes mellitus with other specified complication: Secondary | ICD-10-CM | POA: Diagnosis not present

## 2021-01-14 DIAGNOSIS — L719 Rosacea, unspecified: Secondary | ICD-10-CM | POA: Diagnosis not present

## 2021-01-14 DIAGNOSIS — E871 Hypo-osmolality and hyponatremia: Secondary | ICD-10-CM | POA: Diagnosis not present

## 2021-01-14 DIAGNOSIS — J302 Other seasonal allergic rhinitis: Secondary | ICD-10-CM | POA: Diagnosis not present

## 2021-01-14 DIAGNOSIS — E039 Hypothyroidism, unspecified: Secondary | ICD-10-CM | POA: Diagnosis not present

## 2021-02-12 DIAGNOSIS — K121 Other forms of stomatitis: Secondary | ICD-10-CM | POA: Diagnosis not present

## 2021-03-07 ENCOUNTER — Encounter: Payer: Self-pay | Admitting: Internal Medicine

## 2021-03-27 ENCOUNTER — Other Ambulatory Visit: Payer: Self-pay

## 2021-03-27 ENCOUNTER — Inpatient Hospital Stay (HOSPITAL_COMMUNITY)
Admission: EM | Admit: 2021-03-27 | Discharge: 2021-03-29 | DRG: 564 | Disposition: A | Discharge status: Home Health | Payer: Medicare PPO | Attending: Family Medicine | Admitting: Family Medicine

## 2021-03-27 ENCOUNTER — Emergency Department (HOSPITAL_COMMUNITY): Payer: Medicare PPO

## 2021-03-27 ENCOUNTER — Encounter (HOSPITAL_COMMUNITY): Payer: Self-pay | Admitting: Emergency Medicine

## 2021-03-27 DIAGNOSIS — S0990XA Unspecified injury of head, initial encounter: Secondary | ICD-10-CM | POA: Diagnosis not present

## 2021-03-27 DIAGNOSIS — E871 Hypo-osmolality and hyponatremia: Secondary | ICD-10-CM | POA: Diagnosis present

## 2021-03-27 DIAGNOSIS — D649 Anemia, unspecified: Secondary | ICD-10-CM | POA: Diagnosis present

## 2021-03-27 DIAGNOSIS — I10 Essential (primary) hypertension: Secondary | ICD-10-CM | POA: Diagnosis not present

## 2021-03-27 DIAGNOSIS — Z860100 Personal history of colon polyps, unspecified: Secondary | ICD-10-CM

## 2021-03-27 DIAGNOSIS — R339 Retention of urine, unspecified: Secondary | ICD-10-CM | POA: Diagnosis not present

## 2021-03-27 DIAGNOSIS — T796XXA Traumatic ischemia of muscle, initial encounter: Secondary | ICD-10-CM | POA: Diagnosis not present

## 2021-03-27 DIAGNOSIS — R059 Cough, unspecified: Secondary | ICD-10-CM | POA: Diagnosis not present

## 2021-03-27 DIAGNOSIS — M6282 Rhabdomyolysis: Secondary | ICD-10-CM | POA: Diagnosis present

## 2021-03-27 DIAGNOSIS — W19XXXA Unspecified fall, initial encounter: Secondary | ICD-10-CM | POA: Diagnosis present

## 2021-03-27 DIAGNOSIS — Z7984 Long term (current) use of oral hypoglycemic drugs: Secondary | ICD-10-CM | POA: Diagnosis not present

## 2021-03-27 DIAGNOSIS — R748 Abnormal levels of other serum enzymes: Secondary | ICD-10-CM

## 2021-03-27 DIAGNOSIS — I517 Cardiomegaly: Secondary | ICD-10-CM | POA: Diagnosis not present

## 2021-03-27 DIAGNOSIS — R531 Weakness: Secondary | ICD-10-CM | POA: Diagnosis not present

## 2021-03-27 DIAGNOSIS — E861 Hypovolemia: Secondary | ICD-10-CM | POA: Diagnosis not present

## 2021-03-27 DIAGNOSIS — Z79890 Hormone replacement therapy (postmenopausal): Secondary | ICD-10-CM

## 2021-03-27 DIAGNOSIS — E119 Type 2 diabetes mellitus without complications: Secondary | ICD-10-CM

## 2021-03-27 DIAGNOSIS — G319 Degenerative disease of nervous system, unspecified: Secondary | ICD-10-CM | POA: Diagnosis not present

## 2021-03-27 DIAGNOSIS — I6782 Cerebral ischemia: Secondary | ICD-10-CM | POA: Diagnosis not present

## 2021-03-27 DIAGNOSIS — U071 COVID-19: Secondary | ICD-10-CM

## 2021-03-27 DIAGNOSIS — Z79899 Other long term (current) drug therapy: Secondary | ICD-10-CM

## 2021-03-27 DIAGNOSIS — Z87891 Personal history of nicotine dependence: Secondary | ICD-10-CM

## 2021-03-27 DIAGNOSIS — E7800 Pure hypercholesterolemia, unspecified: Secondary | ICD-10-CM | POA: Diagnosis present

## 2021-03-27 DIAGNOSIS — N281 Cyst of kidney, acquired: Secondary | ICD-10-CM | POA: Diagnosis not present

## 2021-03-27 DIAGNOSIS — D72829 Elevated white blood cell count, unspecified: Secondary | ICD-10-CM | POA: Diagnosis not present

## 2021-03-27 DIAGNOSIS — N179 Acute kidney failure, unspecified: Secondary | ICD-10-CM | POA: Diagnosis not present

## 2021-03-27 DIAGNOSIS — E1165 Type 2 diabetes mellitus with hyperglycemia: Secondary | ICD-10-CM | POA: Diagnosis present

## 2021-03-27 DIAGNOSIS — E785 Hyperlipidemia, unspecified: Secondary | ICD-10-CM

## 2021-03-27 DIAGNOSIS — Z66 Do not resuscitate: Secondary | ICD-10-CM | POA: Diagnosis not present

## 2021-03-27 DIAGNOSIS — R7989 Other specified abnormal findings of blood chemistry: Secondary | ICD-10-CM | POA: Diagnosis not present

## 2021-03-27 DIAGNOSIS — Y929 Unspecified place or not applicable: Secondary | ICD-10-CM | POA: Diagnosis not present

## 2021-03-27 DIAGNOSIS — L8990 Pressure ulcer of unspecified site, unspecified stage: Secondary | ICD-10-CM | POA: Insufficient documentation

## 2021-03-27 DIAGNOSIS — E860 Dehydration: Secondary | ICD-10-CM | POA: Diagnosis not present

## 2021-03-27 DIAGNOSIS — R5381 Other malaise: Secondary | ICD-10-CM | POA: Diagnosis not present

## 2021-03-27 LAB — CBC WITH DIFFERENTIAL/PLATELET
Abs Immature Granulocytes: 0.06 10*3/uL (ref 0.00–0.07)
Basophils Absolute: 0 10*3/uL (ref 0.0–0.1)
Basophils Relative: 0 %
Eosinophils Absolute: 0 10*3/uL (ref 0.0–0.5)
Eosinophils Relative: 0 %
HCT: 34.9 % — ABNORMAL LOW (ref 39.0–52.0)
Hemoglobin: 12 g/dL — ABNORMAL LOW (ref 13.0–17.0)
Immature Granulocytes: 0 %
Lymphocytes Relative: 4 %
Lymphs Abs: 0.6 10*3/uL — ABNORMAL LOW (ref 0.7–4.0)
MCH: 31.9 pg (ref 26.0–34.0)
MCHC: 34.4 g/dL (ref 30.0–36.0)
MCV: 92.8 fL (ref 80.0–100.0)
Monocytes Absolute: 1.5 10*3/uL — ABNORMAL HIGH (ref 0.1–1.0)
Monocytes Relative: 11 %
Neutro Abs: 12.2 10*3/uL — ABNORMAL HIGH (ref 1.7–7.7)
Neutrophils Relative %: 85 %
Platelets: 321 10*3/uL (ref 150–400)
RBC: 3.76 MIL/uL — ABNORMAL LOW (ref 4.22–5.81)
RDW: 12.8 % (ref 11.5–15.5)
WBC: 14.4 10*3/uL — ABNORMAL HIGH (ref 4.0–10.5)
nRBC: 0 % (ref 0.0–0.2)

## 2021-03-27 LAB — PROTIME-INR
INR: 1.2 (ref 0.8–1.2)
Prothrombin Time: 15.3 seconds — ABNORMAL HIGH (ref 11.4–15.2)

## 2021-03-27 LAB — GLUCOSE, CAPILLARY
Glucose-Capillary: 152 mg/dL — ABNORMAL HIGH (ref 70–99)
Glucose-Capillary: 212 mg/dL — ABNORMAL HIGH (ref 70–99)

## 2021-03-27 LAB — COMPREHENSIVE METABOLIC PANEL
ALT: 100 U/L — ABNORMAL HIGH (ref 0–44)
AST: 321 U/L — ABNORMAL HIGH (ref 15–41)
Albumin: 3.9 g/dL (ref 3.5–5.0)
Alkaline Phosphatase: 63 U/L (ref 38–126)
Anion gap: 10 (ref 5–15)
BUN: 66 mg/dL — ABNORMAL HIGH (ref 8–23)
CO2: 27 mmol/L (ref 22–32)
Calcium: 8.4 mg/dL — ABNORMAL LOW (ref 8.9–10.3)
Chloride: 87 mmol/L — ABNORMAL LOW (ref 98–111)
Creatinine, Ser: 1.72 mg/dL — ABNORMAL HIGH (ref 0.61–1.24)
GFR, Estimated: 41 mL/min — ABNORMAL LOW (ref >60)
Glucose, Bld: 249 mg/dL — ABNORMAL HIGH (ref 70–99)
Potassium: 4.8 mmol/L (ref 3.5–5.1)
Sodium: 124 mmol/L — ABNORMAL LOW (ref 135–145)
Total Bilirubin: 1.1 mg/dL (ref 0.3–1.2)
Total Protein: 7.8 g/dL (ref 6.5–8.1)

## 2021-03-27 LAB — URINALYSIS, ROUTINE W REFLEX MICROSCOPIC
Bilirubin Urine: NEGATIVE
Glucose, UA: NEGATIVE mg/dL
Ketones, ur: NEGATIVE mg/dL
Leukocytes,Ua: NEGATIVE
Nitrite: NEGATIVE
Protein, ur: 30 mg/dL — AB
Specific Gravity, Urine: 1.015 (ref 1.005–1.030)
pH: 5 (ref 5.0–8.0)

## 2021-03-27 LAB — RETICULOCYTES
Immature Retic Fract: 22.1 % — ABNORMAL HIGH (ref 2.3–15.9)
RBC.: 3.83 MIL/uL — ABNORMAL LOW (ref 4.22–5.81)
Retic Count, Absolute: 51.3 10*3/uL (ref 19.0–186.0)
Retic Ct Pct: 1.3 % (ref 0.4–3.1)

## 2021-03-27 LAB — RAPID URINE DRUG SCREEN, HOSP PERFORMED
Amphetamines: NOT DETECTED
Barbiturates: NOT DETECTED
Benzodiazepines: NOT DETECTED
Cocaine: NOT DETECTED
Opiates: NOT DETECTED
Tetrahydrocannabinol: NOT DETECTED

## 2021-03-27 LAB — LACTIC ACID, PLASMA: Lactic Acid, Venous: 1.4 mmol/L (ref 0.5–1.9)

## 2021-03-27 LAB — VITAMIN B12: Vitamin B-12: 428 pg/mL (ref 180–914)

## 2021-03-27 LAB — TROPONIN I (HIGH SENSITIVITY)
Troponin I (High Sensitivity): 12 ng/L (ref <18)
Troponin I (High Sensitivity): 11 ng/L (ref <18)

## 2021-03-27 LAB — HEMOGLOBIN A1C
Hgb A1c MFr Bld: 5.9 % — ABNORMAL HIGH (ref 4.8–5.6)
Mean Plasma Glucose: 122.63 mg/dL

## 2021-03-27 LAB — FOLATE: Folate: 9.5 ng/mL (ref >5.9)

## 2021-03-27 LAB — FERRITIN: Ferritin: 284 ng/mL (ref 24–336)

## 2021-03-27 LAB — CBG MONITORING, ED
Glucose-Capillary: 238 mg/dL — ABNORMAL HIGH (ref 70–99)
Glucose-Capillary: 149 mg/dL — ABNORMAL HIGH (ref 70–99)

## 2021-03-27 LAB — RESP PANEL BY RT-PCR (FLU A&B, COVID) ARPGX2
Influenza A by PCR: NEGATIVE
Influenza B by PCR: NEGATIVE
SARS Coronavirus 2 by RT PCR: POSITIVE — AB

## 2021-03-27 LAB — IRON AND TIBC
Iron: 48 ug/dL (ref 45–182)
Saturation Ratios: 17 % — ABNORMAL LOW (ref 17.9–39.5)
TIBC: 286 ug/dL (ref 250–450)
UIBC: 238 ug/dL

## 2021-03-27 LAB — MAGNESIUM: Magnesium: 2.9 mg/dL — ABNORMAL HIGH (ref 1.7–2.4)

## 2021-03-27 LAB — CK: Total CK: 11462 U/L — ABNORMAL HIGH (ref 49–397)

## 2021-03-27 LAB — PHOSPHORUS: Phosphorus: 4.2 mg/dL (ref 2.5–4.6)

## 2021-03-27 LAB — VITAMIN D 25 HYDROXY (VIT D DEFICIENCY, FRACTURES): Vit D, 25-Hydroxy: 30.34 ng/mL (ref 30–100)

## 2021-03-27 MED ORDER — TRAZODONE HCL 50 MG PO TABS: 25.0000 mg | ORAL_TABLET | Freq: Every evening | ORAL | Status: DC | PRN

## 2021-03-27 MED ORDER — BEBTELOVIMAB 175 MG/2 ML IV (EUA)
175.0000 mg | Freq: Once | INTRAMUSCULAR | Status: AC
  Administered 2021-03-28: 175 mg via INTRAVENOUS
  Filled 2021-03-27: qty 2

## 2021-03-27 MED ORDER — LACTATED RINGERS IV BOLUS
1000.0000 mL | Freq: Once | INTRAVENOUS | Status: AC
  Administered 2021-03-27: 1000 mL via INTRAVENOUS

## 2021-03-27 MED ORDER — SIMVASTATIN 20 MG PO TABS
10.0000 mg | ORAL_TABLET | Freq: Every day | ORAL | Status: DC
  Administered 2021-03-28: 10 mg via ORAL
  Filled 2021-03-27: qty 1

## 2021-03-27 MED ORDER — LACTATED RINGERS IV SOLN: INTRAVENOUS | Status: DC

## 2021-03-27 MED ORDER — EPINEPHRINE 0.3 MG/0.3ML IJ SOAJ: 0.3000 mg | Freq: Once | INTRAMUSCULAR | Status: AC | PRN

## 2021-03-27 MED ORDER — SENNOSIDES-DOCUSATE SODIUM 8.6-50 MG PO TABS: 1.0000 | ORAL_TABLET | Freq: Every evening | ORAL | Status: DC | PRN

## 2021-03-27 MED ORDER — ALBUTEROL SULFATE HFA 108 (90 BASE) MCG/ACT IN AERS: 2.0000 | INHALATION_SPRAY | Freq: Once | RESPIRATORY_TRACT | Status: AC | PRN

## 2021-03-27 MED ORDER — ENOXAPARIN SODIUM 40 MG/0.4ML IJ SOSY
40.0000 mg | PREFILLED_SYRINGE | Freq: Every day | INTRAMUSCULAR | Status: DC
  Administered 2021-03-27 – 2021-03-29 (×3): 40 mg via SUBCUTANEOUS
  Filled 2021-03-27 (×3): qty 0.4

## 2021-03-27 MED ORDER — METHYLPREDNISOLONE SODIUM SUCC 125 MG IJ SOLR: 125.0000 mg | Freq: Once | INTRAMUSCULAR | Status: AC | PRN

## 2021-03-27 MED ORDER — DIPHENHYDRAMINE HCL 50 MG/ML IJ SOLN: 50.0000 mg | Freq: Once | INTRAMUSCULAR | Status: AC | PRN

## 2021-03-27 MED ORDER — LEVOTHYROXINE SODIUM 88 MCG PO TABS
88.0000 ug | ORAL_TABLET | Freq: Every day | ORAL | Status: DC
  Administered 2021-03-28 – 2021-03-29 (×2): 88 ug via ORAL
  Filled 2021-03-27 (×3): qty 1

## 2021-03-27 MED ORDER — METOPROLOL TARTRATE 5 MG/5ML IV SOLN: 5.0000 mg | Freq: Four times a day (QID) | INTRAVENOUS | Status: DC | PRN

## 2021-03-27 MED ORDER — SODIUM CHLORIDE 0.9 % IV SOLN: INTRAVENOUS | Status: AC | PRN

## 2021-03-27 MED ORDER — SODIUM CHLORIDE 0.9 % IV SOLN: INTRAVENOUS | Status: DC

## 2021-03-27 MED ORDER — ACETAMINOPHEN 325 MG PO TABS: 650.0000 mg | ORAL_TABLET | Freq: Four times a day (QID) | ORAL | Status: DC | PRN

## 2021-03-27 MED ORDER — INSULIN ASPART 100 UNIT/ML IJ SOLN
0.0000 [IU] | Freq: Three times a day (TID) | INTRAMUSCULAR | Status: DC
  Administered 2021-03-27: 2 [IU] via SUBCUTANEOUS
  Administered 2021-03-29: 1 [IU] via SUBCUTANEOUS

## 2021-03-27 MED ORDER — FAMOTIDINE IN NACL 20-0.9 MG/50ML-% IV SOLN: 20.0000 mg | Freq: Once | INTRAVENOUS | Status: AC | PRN

## 2021-03-27 MED ORDER — ONDANSETRON HCL 4 MG PO TABS: 4.0000 mg | ORAL_TABLET | Freq: Four times a day (QID) | ORAL | Status: DC | PRN

## 2021-03-27 MED ORDER — LORATADINE 10 MG PO TABS
10.0000 mg | ORAL_TABLET | Freq: Every day | ORAL | Status: DC | PRN
Start: 2021-03-27 — End: 2021-03-29

## 2021-03-27 MED ORDER — ONDANSETRON HCL 4 MG/2ML IJ SOLN: 4.0000 mg | Freq: Four times a day (QID) | INTRAMUSCULAR | Status: DC | PRN

## 2021-03-27 MED ORDER — ACETAMINOPHEN 650 MG RE SUPP: 650.0000 mg | Freq: Four times a day (QID) | RECTAL | Status: DC | PRN

## 2021-03-27 MED ORDER — INSULIN ASPART 100 UNIT/ML IJ SOLN
2.0000 [IU] | Freq: Three times a day (TID) | INTRAMUSCULAR | Status: DC
  Administered 2021-03-27 – 2021-03-29 (×3): 2 [IU] via SUBCUTANEOUS

## 2021-03-27 NOTE — Plan of Care (Signed)
  Problem: Acute Rehab PT Goals(only PT should resolve) Goal: Pt Will Go Supine/Side To Sit Outcome: Progressing Flowsheets (Taken 03/27/2021 1459) Pt will go Supine/Side to Sit:  with minimal assist  with moderate assist Goal: Patient Will Transfer Sit To/From Stand Outcome: Progressing Flowsheets (Taken 03/27/2021 1459) Patient will transfer sit to/from stand: with minimal assist Goal: Pt Will Transfer Bed To Chair/Chair To Bed Outcome: Progressing Flowsheets (Taken 03/27/2021 1459) Pt will Transfer Bed to Chair/Chair to Bed: with min assist Goal: Pt Will Ambulate Outcome: Progressing Flowsheets (Taken 03/27/2021 1459) Pt will Ambulate:  50 feet  with minimal assist  with rolling walker   3:02 PM, 03/27/21 Lonell Grandchild, MPT Physical Therapist with [facility] [phone] office [phone] mobile phone

## 2021-03-27 NOTE — H&P (Addendum)
History and Physical    Henry [name]:[id number] DOB: 09/22/1946 DOA: 03/27/2021  Referring MD/NP/PA: Roby Lofts MD PCP: Celene Squibb, MD  Patient coming from: home  Chief Complaint: fell down, weakness  HPI: Henry [name] is a 75 y.o. male with medical history significant of hypertension, hyperlipidemia, diabetes came in with chief complaint of fall. Patient brought here by EMS. Reports that he fell on Sunday afternoon while watching TV. He lives by himself and does not volunteer any contact information. He hydrated himself with apple juice. It did NOT occur to him to call EMS. This morning he called one of his friends, who went to check on him and called EMS as patient was unable to get up.   Patient does not recall falling down or why he fell. He was not able to urinate or had anything to eat since he fell. Denies tunnel vision or dizziness. Denies increased urinary frequency/urgency leading up to syncope. Denies headaches, nausea, vomiting, loss of appetite, myalgia, neck pain, focal numbness, tingling, or ataxia. Denies any chest pain, shortness of breath, palpitations. Though endorses feeling feverish. Endorses improvement in abdominal pain after foley catheter was placed.  Denies any history of stroke, seizure, or cardiac diseases and similar episode in the past.   Patient is retired from [employer] since 2007. He spends his days gardening and working out.   ED Course:  Patient presented with chief complaint of "fall" and was found to have bilateral lower extremity weakness though with good mentation. Patient was hemodynamically stable with BP 129/58, RR 16, pulse 64, Temp 98.3. Work up was significant for leukocytosis WBC 14.4, anemia Hgb 12.0, CK elevated up to ~11k, though neg troponin x2, Na 124, BUN/Cr 66/1.72 consistent with pre-renal etiology and volume down, elevated LFTs (AST/ALT 321/100). Renal US found bladder full of >1L urine. Urinary catheter was placed with good UOP. Urine  analysis unremarkable for UTI though with large amount of blood and protein. Utox negative for everything. CT head showed no evidence of CVA. [facility]-82 showed sinus rhythm, regular rate (66bpm). Patient received 2L LR bolus, and another 2L maintenance fluid is administered over the next 8 hours.   Review of Systems:  Constitutional: felt slightly feverish prior to fall HEENT: denies changes in vision,  Cardio: as per HPI Respiratory: denies cough, otherwise as per HPI Urinary: as per HPI GI: as per HPI Neuro: as per HPI Skin: new bruises due to mild trauma to left leg All other systems reviewed and reported negative  Past Medical History:  Diagnosis Date   Diabetes mellitus without complication ([facility])    Hypercholesteremia    Hypertension     Past Surgical History:  Procedure Laterality Date   APPENDECTOMY     COLONOSCOPY N/A 02/25/2018   Procedure: COLONOSCOPY;  Surgeon: Danie Binder, MD;  Location: [facility];  Service: Endoscopy;  Laterality: N/A;  8:30   Left leg surgery     NASAL SEPTUM SURGERY     NASAL SINUS SURGERY     POLYPECTOMY  02/25/2018   Procedure: POLYPECTOMY;  Surgeon: Danie Binder, MD;  Location: [facility];  Service: Endoscopy;;  ascending x2, descending x2, hepatic flexure polyps x2   TONSILLECTOMY       reports that he has quit smoking. He has a 1.00 pack-year smoking history. He has never used smokeless tobacco. He reports current alcohol use of about 12.0 standard drinks of alcohol per week. He reports that he does not use drugs.  No Known Allergies  Family History  Problem Relation Age of Onset   Colon cancer Neg Hx    Colon polyps Neg Hx     Prior to Admission medications   Medication Sig Start Date End Date Taking? Authorizing Provider  cetirizine (ZYRTEC) 10 MG tablet Take 10 mg by mouth daily as needed for allergies.    Yes [provider]  glipiZIDE (GLUCOTROL) 10 MG tablet Take 10 mg by mouth daily. 12/26/20  Yes [provider]  hydrochlorothiazide (MICROZIDE) 12.5 MG capsule Take 12.5 mg by mouth daily.   Yes [provider]  levothyroxine (SYNTHROID) 88 MCG tablet Take 88 mcg by mouth daily. 02/12/21  Yes [provider]  metFORMIN (GLUCOPHAGE) 500 MG tablet Take 500mg  by mouth twice daily   Yes [provider]  Multiple Vitamins-Minerals (CENTRUM ADULTS PO) Take 1 tablet by mouth daily.   Yes [provider]  naproxen sodium (ALEVE) 220 MG tablet Take 440 mg by mouth daily as needed (pain).   Yes [provider]  Omega-3 Fatty Acids (FISH OIL) 1200 MG CAPS Take 2,400 mg by mouth daily.   Yes [provider]  simvastatin (ZOCOR) 40 MG tablet Take 40mg  by mouth daily   Yes [provider]  telmisartan (MICARDIS) 40 MG tablet Take 40 mg by mouth daily.   Yes [provider]    Physical Exam: Vitals:   03/27/21 7262 03/27/21 0956 03/27/21 0957 03/27/21 0958  BP:    (!) 129/58  Pulse: 65 66 63 64  Resp: 14 (!) 21 15 16   Temp:      TempSrc:      SpO2: 99% 100% 98% 97%  Weight:      Height:       Constitutional: NAD, calm, comfortable Eyes: PERRL, lids and conjunctivae normal ENMT: Mucous membranes are dry Neck: normal, supple, no masses, no thyromegaly Respiratory: clear to auscultation bilaterally, no wheezing, no crackles. Normal respiratory effort. No accessory muscle use.  Cardiovascular: Regular rate and rhythm, no murmurs / rubs / gallops. No extremity edema. 2+ pedal pulses. No carotid bruits.  Abdomen: no tenderness, no masses palpated. No hepatosplenomegaly. Bowel sounds positive.  Extremities: cold feet, 2+ pedal pulses, <2 sec capillary refills. Skin: no rashes, lesions, ulcers. No induration Neurologic: CN 2-12 grossly intact. Sensation intact, DTR normal. Unable to flex hips above bed. Strength 5/5 dorsiflexion and plantarflexion. Strength 5/5 in upper extremities with hand grip. No pronator drip appreciated.   Psychiatric: POOR judgment and insight. Alert and oriented x 3. Flat affect.    Labs on Admission: I have personally reviewed following labs and imaging studies  CBC: Recent Labs  Lab 03/27/21 0729  WBC 14.4*  NEUTROABS 12.2*  HGB 12.0*  HCT 34.9*  MCV 92.8  PLT 035   Basic Metabolic Panel: Recent Labs  Lab 03/27/21 0729  NA 124*  K 4.8  CL 87*  CO2 27  GLUCOSE 249*  BUN 66*  CREATININE 1.72*  CALCIUM 8.4*  MG 2.9*  PHOS 4.2   GFR: Estimated Creatinine Clearance: 39.2 mL/min (A) (by C-G formula based on SCr of 1.72 mg/dL (H)). Liver Function Tests: Recent Labs  Lab 03/27/21 0729  AST 321*  ALT 100*  ALKPHOS 63  BILITOT 1.1  PROT 7.8  ALBUMIN 3.9   No results for input(s): LIPASE, AMYLASE in the last 168 hours. No results for input(s): AMMONIA in the last 168 hours. Coagulation Profile: Recent Labs  Lab 03/27/21 0729  INR  1.2   Cardiac Enzymes: Recent Labs  Lab 03/27/21 0729  CKTOTAL 11,462*   BNP (last 3 results) No results for input(s): PROBNP in the last 8760 hours. HbA1C: No results for input(s): HGBA1C in the last 72 hours. CBG: Recent Labs  Lab 03/27/21 0819  GLUCAP 238*   Lipid Profile: No results for input(s): CHOL, HDL, LDLCALC, TRIG, CHOLHDL, LDLDIRECT in the last 72 hours. Thyroid Function Tests: No results for input(s): TSH, T4TOTAL, FREET4, T3FREE, THYROIDAB in the last 72 hours. Anemia Panel: No results for input(s): VITAMINB12, FOLATE, FERRITIN, TIBC, IRON, RETICCTPCT in the last 72 hours. Urine analysis:    Component Value Date/Time   COLORURINE YELLOW 03/27/2021 0730   APPEARANCEUR HAZY (A) 03/27/2021 0730   LABSPEC 1.015 03/27/2021 0730   PHURINE 5.0 03/27/2021 0730   GLUCOSEU NEGATIVE 03/27/2021 0730   HGBUR LARGE (A) 03/27/2021 0730   BILIRUBINUR NEGATIVE 03/27/2021 0730   KETONESUR NEGATIVE 03/27/2021 0730   PROTEINUR 30 (A) 03/27/2021 0730   NITRITE NEGATIVE 03/27/2021 0730   LEUKOCYTESUR NEGATIVE 03/27/2021  0730   No results found for this or any previous visit (from the past 240 hour(s)).   Radiological Exams on Admission: CT Head Wo Contrast  Result Date: 03/27/2021 CLINICAL DATA:  75 year old male with history of trauma from a fall out of a chair. EXAM: CT HEAD WITHOUT CONTRAST TECHNIQUE: Contiguous axial images were obtained from the base of the skull through the vertex without intravenous contrast. COMPARISON:  No priors. FINDINGS: Brain: Mild cerebral atrophy. Patchy and confluent areas of decreased attenuation are noted throughout the deep and periventricular white matter of the cerebral hemispheres bilaterally, compatible with chronic microvascular ischemic disease. No evidence of acute infarction, hemorrhage, hydrocephalus, extra-axial collection or mass lesion/mass effect. Vascular: No hyperdense vessel or unexpected calcification. Skull: Normal. Negative for fracture or focal lesion. Sinuses/Orbits: No acute finding. Other: Right middle ear and mastoid effusion. IMPRESSION: 1. No acute intracranial abnormalities. 2. Right middle ear and mastoid effusion. 3. Mild cerebral atrophy with chronic microvascular ischemic changes in the cerebral white matter, as above. Electronically Signed   By: Vinnie Langton M.D.   On: 03/27/2021 08:24   US Renal  Result Date: 03/27/2021 CLINICAL DATA:  Urinary retention.  Acute kidney injury. EXAM: RENAL / URINARY TRACT ULTRASOUND COMPLETE COMPARISON:  None. FINDINGS: Right Kidney: Renal measurements: 11.4 x 5.3 x 6.5 cm = volume: 208.3 mL. Echogenicity within normal limits. No mass or hydronephrosis visualized. Left Kidney: Renal measurements: 10.6 x 5.8 x 5.1 cm = volume: 162.9 mL. Echogenicity within normal limits. Simple 2.2 cm cyst upper pole noted. No solid mass or hydronephrosis visualized. Bladder: Completely decompressed with a Foley catheter in place. Other: None. IMPRESSION: No acute abnormality.  Negative for hydronephrosis. 2.2 cm left renal cyst noted.  Electronically Signed   By: Inge Rise M.D.   On: 03/27/2021 10:51    EKG: Independently reviewed. EKG showed sinus rhythm, regular rate at 20, Qtc 69  Assessment/Plan  75 yo male with pertinent medical history of diabetes, hypertension, and hyperlipidemia presented to the ED with chief complaint of syncope found to have urinary retention, leukocytosis WBC 14.4, CK elevated to ~11k and BUN/Cr 66/1.7 concerning for AKI. Patient was admitted for rhabdomyolysis and replacement of fluid as well as work-up for initial syncopal episode.   Question of Syncope / Fall at home - unable to obtain much history. Etiologies include infection in setting of leukocytosis WBC 14.4 vs vasovagal vs seizure. Stroke and cardiac etiologies less likely  given negative CT head and reassuring EKG.  - CXR negative for local infiltrates, pleural effusion - UA negative for infection and only remarkable for myoglobinuria (consistent with rhabdo) - Continue to monitor - F/u with PCP outpatient  Rhabdomyolysis - in setting of dehydration, limited PO intake. CK ~11k, AST/ALT elevated at 321/100. Last creatinine 2018 1.02, up to 1.7 on admission. Pain most significant in hips. - 2L IV LR bolus given - Continue IV NS 2101ml/hr - Trend CK, expect 40-50% decrease daily with hydration/resolution of rhabdo - Daily CMP - Daily I & Os  3. Leukocytosis - WBC 14.4 in setting of dehydration, syncope, rhabdomyolysis. Infectious workup including CXR and U/A unremarkable for pneumonia or UTI. Rapid COVID-19 and influenza pending.  - Daily CBC - F/u COVID-19 and influenza screening - REPEAT CXR in AM after hydration   4. Elevated LFTs - AST 321, ALT 100 in setting of rhabdomyolysis. AST/ALT have been found to be elevated in patients with rhabdo. Patient with reported history of alcohol use, though not high/unusual amount (7-12 beers/week).  - Trend LFTs with daily CMP; should improve with resolution of rhabdo/dehdyration  5.  Acute kidney injury - BUN/Cr ratio >20:1, suggestive of pre-renal etiology. Most likely secondary to dehydration and rhabdomyolysis - 2L IV LR bolus given - Continue IV NS 21ml/hr - Trend creatinine with daily CMP - Daily I&Os  6. Anemia - Hgb 12.0. Previous values seem to be 15-16. Had history of rectal bleeding last year and follow up with GI, thought to be hemorrhoids. Per GI visit with Dr. 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 - Trend with daily CBC - FOLLOW UP ANEMIA PANEL  7. Hyponatremia - Na 124. In setting of hypovolemia and acute kidney injury. FOLLOW CLOSELY WITH DAILY CMP  8. T2DM - controlled with glipizide and metformin at home. Last A1c 2018 was 5.6. Blood glucose was 249 on admission despite much PO intake. Last fingerstick was 149  - HOLD home glipizide and metformin - F/u HgB A1c - SSI   9. Hypertension - Hold home HCTZ in setting of hypovolemia and soft diastolic pressure (182/99) -IV meds ordered for elevated BP readings.   DNR - present on admission, confirmed with patient, continue DNR order while in hospital.    ACUTE URINARY RETENTION  - BLADDER SCAN WITH >1000 CC , FOLEY PLACED FOR BLADDER DECOMPRESSION,  - PLAN TO REMOVE FOLEY IN 24 HOURS AND DO VOIDING TRIAL.   - PT REPORTS THAT HE DOES NOT NORMALLY HAVE DIFFICULTY URINATING.    ASYMPTOMATIC COVID INFECTION  -PT IS HIGH RISK FOR COVID COMPLICATIONS GIVEN ADVANCED AGE, IMMUNOCOMPROMISED STATE.  - TREATING WITH MONOCLONAL ANTIBODY TREATMENT X 1 DOSE AND SUPPORTIVE MEASURES.   - FOLLOW CLINICALLY.      DVT prophylaxis: Lovenox 40mg  SQ Code Status: DNR Family Communication: plan of care discussed with patient at bedside who verbalized understanding Disposition Plan: home Consults called: none Admission status: inpatient  Severity of Illness: The appropriate patient status for this patient is INPATIENT. Inpatient status is judged to be  reasonable and necessary in order to provide the required intensity of service to ensure the patient's safety. The patient's presenting symptoms, physical exam findings, and initial radiographic and laboratory data in the context of their chronic comorbidities is felt to place them at high risk for further clinical deterioration. Furthermore, it is not anticipated that the patient will be medically stable for discharge from the hospital within 2 midnights of  admission. The following factors support the patient status of inpatient.   " The patient's presenting symptoms include syncope, weakness " The worrisome physical exam findings include weakness " The initial radiographic and laboratory data are worrisome because of elevated CK ~11k concerning for rhabdomyolysis, Cr up to 1.7 concerning for AKI, leukocytosis concerning for infections " The chronic co-morbidities include hypertension, T2 diabetes, and hyperlipidemia   * I certify that at the point of admission it is my clinical judgment that the patient will require inpatient hospital care spanning beyond 2 midnights from the point of admission due to high intensity of service, high risk for further deterioration and high frequency of surveillance required.*   [place] student [facility] Pager [phone]  If 7PM-7AM, please contact night-coverage [email or web address] Password [email or web address]  03/27/2021, 11:00 AM  ___________________________________________________________________________________  ATTENDING NOTE   Patient seen and examined with Fredrik Rigger, Medical student. In addition to supervising the encounter, I played a key role in the decision making process as well as reviewed key findings.  I have reviewed and edited notes as needed.  Please see updates.  Please see orders.  Pt has a severe rhabdomyolysis from being down on floor for 24 hours.  Pt presents with markedly elevated CK with AKI and elevated LFTs and hyponatremia.   Follow CK trend. Follow renal function and LFTs.  May consider adding sodium bicarbonate to IV fluid. CO2 normal at this time.   Follow.    Murvin Natal MD FAAFP How to contact the [facility] Attending or Consulting provider [place] or covering provider during after hours [place], for this patient?  Check the care team in [facility] and look for a) attending/consulting [facility] provider listed and b) the [facility] team listed Log into [email or web address] and use [place]'s universal password to access. If you do not have the password, please contact the hospital operator. Locate the [facility] provider you are looking for under [facility] and page to a number that you can be directly reached. If you still have difficulty reaching the provider, please page the [facility] (Director on Call) for the Hospitalists listed on amion for assistance.

## 2021-03-27 NOTE — ED Triage Notes (Signed)
Pt states he fell sometime Sunday and was unable to get up. Pt has been drinking on apple juice since fall. He called friend this am to help him out of the floor but had to call ems. Pt usually ambulates but is unable to at this time. Pt not sure how he fell.

## 2021-03-27 NOTE — Progress Notes (Signed)
Lab called with POSITIVE COVID result.  Dr. Wynetta Emery notified via [place]. Charge Nurse notified and will move patient to appropriate room

## 2021-03-27 NOTE — ED Provider Notes (Signed)
[facility] EMERGENCY DEPARTMENT Provider Note   CSN: [id number] Arrival date & time: 03/27/21  0254     History Chief Complaint  Patient presents with   Lytle Michaels    Henry [name] is a 75 y.o. male.  HPI     75 year old male with history of hypertension, hyperlipidemia, diabetes comes in with chief complaint of fall.  Patient brought here by EMS.  Patient reports that he fell on Sunday.  He lives by himself.  He hydrated himself with apple juice.  It did not occur to him to call EMS.  This morning he called one of his friends, went to check on him and called EMS as patient was unable to get up.   Patient does not recall falling down, or why he fell.  He denies any headaches, nausea, vomiting, neck pain, focal numbness or tingling.  Patient also denies any chest pain, shortness of breath, palpitations.  He is having some lower quadrant abdominal pain.  He denies any back pain, extremity pain.   Patient also denies any history of strokes and similar episode in the past.  Past Medical History:  Diagnosis Date   Diabetes mellitus without complication ([place])    Hypercholesteremia    Hypertension     Patient Active Problem List   Diagnosis Date Noted   Rhabdomyolysis 03/27/2021   Hypertension 03/27/2021   Hyponatremia 03/27/2021   Hyperglycemia 03/27/2021   Hyperlipidemia 03/27/2021   Constipation 08/02/2020   Rectal bleeding 08/02/2020   Personal history of colonic polyps     Past Surgical History:  Procedure Laterality Date   APPENDECTOMY     COLONOSCOPY N/A 02/25/2018   Procedure: COLONOSCOPY;  Surgeon: Danie Binder, MD;  Location: [facility];  Service: Endoscopy;  Laterality: N/A;  8:30   Left leg surgery     NASAL SEPTUM SURGERY     NASAL SINUS SURGERY     POLYPECTOMY  02/25/2018   Procedure: POLYPECTOMY;  Surgeon: Danie Binder, MD;  Location: [facility];  Service: Endoscopy;;  ascending x2, descending x2, hepatic flexure polyps x2   TONSILLECTOMY          Family History  Problem Relation Age of Onset   Colon cancer Neg Hx    Colon polyps Neg Hx     Social History   Tobacco Use   Smoking status: Former    Packs/day: 0.25    Years: 4.00    Pack years: 1.00    Types: Cigarettes   Smokeless tobacco: Never  Vaping Use   Vaping Use: Never used  Substance Use Topics   Alcohol use: Yes    Alcohol/week: 12.0 standard drinks    Types: 12 Cans of beer per week   Drug use: Never    Home Medications Prior to Admission medications   Medication Sig Start Date End Date Taking? Authorizing Provider  cetirizine (ZYRTEC) 10 MG tablet Take 10 mg by mouth daily as needed for allergies.    Yes [provider]  glipiZIDE (GLUCOTROL) 10 MG tablet Take 10 mg by mouth daily. 12/26/20  Yes [provider]  hydrochlorothiazide (MICROZIDE) 12.5 MG capsule Take 12.5 mg by mouth daily.   Yes [provider]  levothyroxine (SYNTHROID) 88 MCG tablet Take 88 mcg by mouth daily. 02/12/21  Yes [provider]  metFORMIN (GLUCOPHAGE) 500 MG tablet Take 500mg  by mouth twice daily   Yes [provider]  Multiple Vitamins-Minerals (CENTRUM ADULTS PO) Take 1 tablet by mouth daily.  Yes [provider]  naproxen sodium (ALEVE) 220 MG tablet Take 440 mg by mouth daily as needed (pain).   Yes [provider]  Omega-3 Fatty Acids (FISH OIL) 1200 MG CAPS Take 2,400 mg by mouth daily.   Yes [provider]  simvastatin (ZOCOR) 40 MG tablet Take 40mg  by mouth daily   Yes [provider]  telmisartan (MICARDIS) 40 MG tablet Take 40 mg by mouth daily.   Yes [provider]    Allergies    Patient has no known allergies.  Review of Systems   Review of Systems  Constitutional:  Positive for activity change.  Respiratory:  Negative for shortness of breath.   Cardiovascular:  Negative for chest pain.  Genitourinary:  Positive for enuresis. Negative for hematuria.   Musculoskeletal:  Negative for back pain.  Allergic/Immunologic: Negative for immunocompromised state.  Neurological:  Positive for weakness. Negative for numbness and headaches.  Hematological:  Does not bruise/bleed easily.  All other systems reviewed and are negative.  Physical Exam Updated Vital Signs BP (!) 129/58   Pulse 64   Temp 98.3 F (36.8 C) (Oral)   Resp 16   Ht 5\' 5"  (1.651 m)   Wt 94.3 kg   SpO2 97%   BMI 34.61 kg/m   Physical Exam Vitals and nursing note reviewed.  Constitutional:      Appearance: He is well-developed.  HENT:     Head: Atraumatic.  Eyes:     Extraocular Movements: Extraocular movements intact.     Pupils: Pupils are equal, round, and reactive to light.  Cardiovascular:     Rate and Rhythm: Normal rate.     Heart sounds: No murmur heard. Pulmonary:     Effort: Pulmonary effort is normal.  Abdominal:     General: There is distension.     Tenderness: There is abdominal tenderness. There is no guarding or rebound.     Comments: Lower quadrant abdominal distention  Musculoskeletal:        General: No tenderness or deformity.     Cervical back: Neck supple.     Comments: Lower extremity strength is 3 out of 5 bilaterally Gross sensory exam for upper and lower extremities normal. No lumbar spine tenderness.  Skin:    General: Skin is warm.  Neurological:     Mental Status: He is alert and oriented to person, place, and time.    ED Results / Procedures / Treatments   Labs (all labs ordered are listed, but only abnormal results are displayed) Labs Reviewed  COMPREHENSIVE METABOLIC PANEL - Abnormal; Notable for the following components:      Result Value   Sodium 124 (*)    Chloride 87 (*)    Glucose, Bld 249 (*)    BUN 66 (*)    Creatinine, Ser 1.72 (*)    Calcium 8.4 (*)    AST 321 (*)    ALT 100 (*)    GFR, Estimated 41 (*)    All other components within normal limits  CBC WITH DIFFERENTIAL/PLATELET - Abnormal; Notable for  the following components:   WBC 14.4 (*)    RBC 3.76 (*)    Hemoglobin 12.0 (*)    HCT 34.9 (*)    Neutro Abs 12.2 (*)    Lymphs Abs 0.6 (*)    Monocytes Absolute 1.5 (*)    All other components within normal limits  PROTIME-INR - Abnormal; Notable for the following components:   Prothrombin Time 15.3 (*)  All other components within normal limits  URINALYSIS, ROUTINE W REFLEX MICROSCOPIC - Abnormal; Notable for the following components:   APPearance HAZY (*)    Hgb urine dipstick LARGE (*)    Protein, ur 30 (*)    Bacteria, UA RARE (*)    All other components within normal limits  CK - Abnormal; Notable for the following components:   Total CK 11,462 (*)    All other components within normal limits  MAGNESIUM - Abnormal; Notable for the following components:   Magnesium 2.9 (*)    All other components within normal limits  CBG MONITORING, ED - Abnormal; Notable for the following components:   Glucose-Capillary 238 (*)    All other components within normal limits  RAPID URINE DRUG SCREEN, HOSP PERFORMED  LACTIC ACID, PLASMA  PHOSPHORUS  CBC WITH DIFFERENTIAL/PLATELET  TROPONIN I (HIGH SENSITIVITY)  TROPONIN I (HIGH SENSITIVITY)    EKG EKG Interpretation  Date/Time:  Wednesday March 27 2021 06:42:46 EDT Ventricular Rate:  66 PR Interval:  184 QRS Duration: 96 QT Interval:  403 QTC Calculation: 423 R Axis:   -5 Text Interpretation: Sinus rhythm Baseline wander in lead(s) III aVF Confirmed by Orpah Greek [phone]) on 03/27/2021 7:02:05 AM  Radiology CT Head Wo Contrast  Result Date: 03/27/2021 CLINICAL DATA:  75 year old male with history of trauma from a fall out of a chair. EXAM: CT HEAD WITHOUT CONTRAST TECHNIQUE: Contiguous axial images were obtained from the base of the skull through the vertex without intravenous contrast. COMPARISON:  No priors. FINDINGS: Brain: Mild cerebral atrophy. Patchy and confluent areas of decreased attenuation are noted throughout  the deep and periventricular white matter of the cerebral hemispheres bilaterally, compatible with chronic microvascular ischemic disease. No evidence of acute infarction, hemorrhage, hydrocephalus, extra-axial collection or mass lesion/mass effect. Vascular: No hyperdense vessel or unexpected calcification. Skull: Normal. Negative for fracture or focal lesion. Sinuses/Orbits: No acute finding. Other: Right middle ear and mastoid effusion. IMPRESSION: 1. No acute intracranial abnormalities. 2. Right middle ear and mastoid effusion. 3. Mild cerebral atrophy with chronic microvascular ischemic changes in the cerebral white matter, as above. Electronically Signed   By: Vinnie Langton M.D.   On: 03/27/2021 08:24    Procedures .Critical Care  Date/Time: 03/27/2021 10:45 AM Performed by: Varney Biles, MD Authorized by: Varney Biles, MD   Critical care provider statement:    Critical care time (minutes):  52   Critical care was time spent personally by me on the following activities:  Discussions with consultants, evaluation of patient's response to treatment, examination of patient, ordering and performing treatments and interventions, ordering and review of laboratory studies, ordering and review of radiographic studies, pulse oximetry, re-evaluation of patient's condition, obtaining history from patient or surrogate and review of old charts   Medications Ordered in ED Medications  lactated ringers infusion (has no administration in time range)  lactated ringers bolus 1,000 mL (1,000 mLs Intravenous Bolus 03/27/21 0750)  lactated ringers bolus 1,000 mL (1,000 mLs Intravenous Bolus 03/27/21 0956)    ED Course  I have reviewed the triage vital signs and the nursing notes.  Pertinent labs & imaging results that were available during my care of the patient were reviewed by me and considered in my medical decision making (see chart for details).    MDM Rules/Calculators/A&P                           75 year old comes in  a chief complaint of fall.  It is entirely possible the patient had a syncopal spell as he does not recall the fall itself.  Postconcussion syndrome is another possibility.  No history of cardiac disease and EKG is reassuring.  Patient's fall was several days ago.  For some reason he did not call for help until today.  CK was ordered and is elevated over 10,000.  2 L of LR along with 250cc/hr into 8-hour of maintenance fluid ordered.  Of note, patient had lower abdominal distention and tenderness.  He said he did not urinate.  Our bladder scan revealed more than 1000 mL of urine in his bladder.  Unclear why patient could not urinate, he thinks it was the position he was in an inability to walk.  His back is pain-free he has no known history of chronic lumbar spine issues and there are no red flags for cauda equina besides the urinary retention.  No history of cancer.  A Foley catheter has been placed, but the medicine team has been advised to look into this further if patient does not improve.  For now ultrasound renal only added for the AKI and urinary retention.  Final Clinical Impression(s) / ED Diagnoses Final diagnoses:  Traumatic rhabdomyolysis, initial encounter ([place])  AKI (acute kidney injury) ([place])  Urinary retention    Rx / DC Orders ED Discharge Orders     None        Varney Biles, MD 03/27/21 1047

## 2021-03-27 NOTE — Evaluation (Signed)
Physical Therapy Evaluation Patient Details Name: Henry [name] MRN: [id number] DOB: [birth year] Today's Date: 03/27/2021   History of Present Illness  Henry [name] is a 75 y.o. male with medical history significant of hypertension, hyperlipidemia, diabetes came in with chief complaint of fall. Patient brought here by EMS. Reports that he fell on Sunday afternoon while watching TV. He lives by himself and does not volunteer any contact information. He hydrated himself with apple juice. It did NOT occur to him to call EMS. This morning he called one of his friends, who went to check on him and called EMS as patient was unable to get up.   Clinical Impression  Patient demonstrates slow labored movement for sitting up at bedside requiring Mod/max assist to pull self to sitting with HOB flat, had difficulty completing sit to stands without AD due to BLE weakness and required use of RW for safet during transfers and ambulation in room.  Patient unsteady on feet with frequent near loss of balance during gait training using RW and limited mostly due to fatigue.  Patient tolerated sitting up in chair after therapy.  Patient will benefit from continued physical therapy in hospital and recommended venue below to increase strength, balance, endurance for safe ADLs and gait.       Follow Up Recommendations SNF    Equipment Recommendations  Rolling walker with 5" wheels    Recommendations for Other Services       Precautions / Restrictions Precautions Precautions: Fall Restrictions Weight Bearing Restrictions: No      Mobility  Bed Mobility Overal bed mobility: Needs Assistance Bed Mobility: Supine to Sit     Supine to sit: Mod assist;Max assist     General bed mobility comments: increased time, labored movement    Transfers Overall transfer level: Needs assistance Equipment used: Rolling walker (2 wheeled) Transfers: Sit to/from [facility] Sit to Stand: Mod assist Stand  pivot transfers: Mod assist       General transfer comment: slow labored movement during sit to stands, required use of RW to maintain standing balance  Ambulation/Gait Ambulation/Gait assistance: Mod assist Gait Distance (Feet): 22 Feet Assistive device: Rolling walker (2 wheeled) Gait Pattern/deviations: Decreased step length - right;Decreased step length - left;Decreased stride length;Trunk flexed Gait velocity: decreased   General Gait Details: slow labored unsteady cadence with frequent leaning over RW for support, limited due to fatigue and poor standing balance  Stairs            Wheelchair Mobility    Modified Rankin (Stroke Patients Only)       Balance Overall balance assessment: Needs assistance Sitting-balance support: Feet supported;No upper extremity supported Sitting balance-Leahy Scale: Fair Sitting balance - Comments: fair/good seated at EOB   Standing balance support: During functional activity;No upper extremity supported Standing balance-Leahy Scale: Poor Standing balance comment: fair/poor using RW                             Pertinent Vitals/Pain Pain Assessment: 0-10 Pain Score: 5  Pain Location: over buttocks Pain Descriptors / Indicators: Discomfort;Sore Pain Intervention(s): Limited activity within patient's tolerance;Monitored during session;Repositioned    Home Living Family/patient expects to be discharged to:: Private residence Living Arrangements: Alone Available Help at Discharge: Family;Available PRN/intermittently Type of Home: House Home Access: Stairs to enter Entrance Stairs-Rails: Right;Left;Can reach both Entrance Stairs-Number of Steps: 4-5 Home Layout: One level Home Equipment: Cane - single point  Prior Function Level of Independence: Independent         Comments: Hydrographic surveyor, drives, does own shopping     Hand Dominance        Extremity/Trunk Assessment   Upper Extremity  Assessment Upper Extremity Assessment: Generalized weakness    Lower Extremity Assessment Lower Extremity Assessment: Generalized weakness    Cervical / Trunk Assessment Cervical / Trunk Assessment: Kyphotic  Communication   Communication: No difficulties  Cognition Arousal/Alertness: Awake/alert Behavior During Therapy: WFL for tasks assessed/performed Overall Cognitive Status: Within Functional Limits for tasks assessed                                        General Comments      Exercises     Assessment/Plan    PT Assessment Patient needs continued PT services  PT Problem List Decreased strength;Decreased activity tolerance;Decreased balance;Decreased mobility       PT Treatment Interventions DME instruction;Gait training;Stair training;Functional mobility training;Therapeutic activities;Therapeutic exercise;Balance training;Patient/family education    PT Goals (Current goals can be found in the Care Plan section)  Acute Rehab PT Goals Patient Stated Goal: return home after rehab PT Goal Formulation: With patient Time For Goal Achievement: 04/10/21 Potential to Achieve Goals: Good    Frequency Min 3X/week   Barriers to discharge        Co-evaluation               AM-PAC PT "6 Clicks" Mobility  Outcome Measure Help needed turning from your back to your side while in a flat bed without using bedrails?: A Lot Help needed moving from lying on your back to sitting on the side of a flat bed without using bedrails?: A Lot Help needed moving to and from a bed to a chair (including a wheelchair)?: A Lot Help needed standing up from a chair using your arms (e.g., wheelchair or bedside chair)?: A Lot Help needed to walk in hospital room?: A Lot Help needed climbing 3-5 steps with a railing? : A Lot 6 Click Score: 12    End of Session   Activity Tolerance: Patient tolerated treatment well;Patient limited by fatigue Patient left: in chair;with  call bell/phone within reach;with chair alarm set Nurse Communication: Mobility status PT Visit Diagnosis: Unsteadiness on feet (R26.81);Other abnormalities of gait and mobility (R26.89);Muscle weakness (generalized) (M62.81)    Time: 9767-3419 PT Time Calculation (min) (ACUTE ONLY): 20 min   Charges:   PT Evaluation $PT Eval Moderate Complexity: 1 Mod PT Treatments $Therapeutic Activity: 8-22 mins        2:57 PM, 03/27/21 Lonell Grandchild, MPT Physical Therapist with [facility] [phone] office [phone] mobile phone

## 2021-03-27 NOTE — Progress Notes (Signed)
Patient arrived to the floor via stretcher.

## 2021-03-28 ENCOUNTER — Inpatient Hospital Stay (HOSPITAL_COMMUNITY): Payer: Medicare PPO

## 2021-03-28 DIAGNOSIS — L8990 Pressure ulcer of unspecified site, unspecified stage: Secondary | ICD-10-CM | POA: Insufficient documentation

## 2021-03-28 LAB — CBC WITH DIFFERENTIAL/PLATELET
Abs Immature Granulocytes: 0.07 10*3/uL (ref 0.00–0.07)
Basophils Absolute: 0 10*3/uL (ref 0.0–0.1)
Basophils Relative: 0 %
Eosinophils Absolute: 0.1 10*3/uL (ref 0.0–0.5)
Eosinophils Relative: 1 %
HCT: 32.3 % — ABNORMAL LOW (ref 39.0–52.0)
Hemoglobin: 10.7 g/dL — ABNORMAL LOW (ref 13.0–17.0)
Immature Granulocytes: 1 %
Lymphocytes Relative: 7 %
Lymphs Abs: 0.7 10*3/uL (ref 0.7–4.0)
MCH: 31.7 pg (ref 26.0–34.0)
MCHC: 33.1 g/dL (ref 30.0–36.0)
MCV: 95.6 fL (ref 80.0–100.0)
Monocytes Absolute: 1.2 10*3/uL — ABNORMAL HIGH (ref 0.1–1.0)
Monocytes Relative: 12 %
Neutro Abs: 7.6 10*3/uL (ref 1.7–7.7)
Neutrophils Relative %: 79 %
Platelets: 300 10*3/uL (ref 150–400)
RBC: 3.38 MIL/uL — ABNORMAL LOW (ref 4.22–5.81)
RDW: 12.7 % (ref 11.5–15.5)
WBC: 9.7 10*3/uL (ref 4.0–10.5)
nRBC: 0 % (ref 0.0–0.2)

## 2021-03-28 LAB — GLUCOSE, CAPILLARY
Glucose-Capillary: 90 mg/dL (ref 70–99)
Glucose-Capillary: 90 mg/dL (ref 70–99)
Glucose-Capillary: 117 mg/dL — ABNORMAL HIGH (ref 70–99)
Glucose-Capillary: 101 mg/dL — ABNORMAL HIGH (ref 70–99)
Glucose-Capillary: 99 mg/dL (ref 70–99)

## 2021-03-28 LAB — COMPREHENSIVE METABOLIC PANEL
ALT: 70 U/L — ABNORMAL HIGH (ref 0–44)
AST: 165 U/L — ABNORMAL HIGH (ref 15–41)
Albumin: 3.1 g/dL — ABNORMAL LOW (ref 3.5–5.0)
Alkaline Phosphatase: 51 U/L (ref 38–126)
Anion gap: 9 (ref 5–15)
BUN: 31 mg/dL — ABNORMAL HIGH (ref 8–23)
CO2: 25 mmol/L (ref 22–32)
Calcium: 8.1 mg/dL — ABNORMAL LOW (ref 8.9–10.3)
Chloride: 97 mmol/L — ABNORMAL LOW (ref 98–111)
Creatinine, Ser: 0.94 mg/dL (ref 0.61–1.24)
GFR, Estimated: >60 mL/min (ref >60)
Glucose, Bld: 104 mg/dL — ABNORMAL HIGH (ref 70–99)
Potassium: 4.5 mmol/L (ref 3.5–5.1)
Sodium: 131 mmol/L — ABNORMAL LOW (ref 135–145)
Total Bilirubin: 0.9 mg/dL (ref 0.3–1.2)
Total Protein: 6.1 g/dL — ABNORMAL LOW (ref 6.5–8.1)

## 2021-03-28 LAB — CK: Total CK: 3371 U/L — ABNORMAL HIGH (ref 49–397)

## 2021-03-28 LAB — PHOSPHORUS: Phosphorus: 2.5 mg/dL (ref 2.5–4.6)

## 2021-03-28 LAB — MAGNESIUM: Magnesium: 2.2 mg/dL (ref 1.7–2.4)

## 2021-03-28 MED ORDER — CHLORHEXIDINE GLUCONATE CLOTH 2 % EX PADS
6.0000 | MEDICATED_PAD | Freq: Every day | CUTANEOUS | Status: DC
  Administered 2021-03-28 – 2021-03-29 (×2): 6 via TOPICAL

## 2021-03-28 MED ORDER — ADULT MULTIVITAMIN W/MINERALS CH
1.0000 | ORAL_TABLET | Freq: Every day | ORAL | Status: DC
  Administered 2021-03-28 – 2021-03-29 (×2): 1 via ORAL
  Filled 2021-03-28 (×2): qty 1

## 2021-03-28 MED ORDER — ENSURE MAX PROTEIN PO LIQD: 11.0000 [oz_av] | Freq: Every day | ORAL | Status: DC

## 2021-03-28 MED ORDER — GLUCERNA SHAKE PO LIQD
237.0000 mL | Freq: Two times a day (BID) | ORAL | Status: DC
  Administered 2021-03-28: 237 mL via ORAL

## 2021-03-28 NOTE — Progress Notes (Signed)
Initial Nutrition Assessment  DOCUMENTATION CODES:   Obesity unspecified  INTERVENTION:   -Glucerna Shake po BID, each supplement provides 220 kcal and 10 grams of protein  -MVI with minerals daily -Ensure Max po daily, each supplement provides 150 kcal and 30 grams of protein.    NUTRITION DIAGNOSIS:   Increased nutrient needs related to wound healing as evidenced by estimated needs.  GOAL:   Patient will meet greater than or equal to 90% of their needs  MONITOR:   PO intake, Supplement acceptance, Diet advancement, Labs, Weight trends, Skin, I & O's  REASON FOR ASSESSMENT:   Consult Assessment of nutrition requirement/status  ASSESSMENT:   75 yo male with pertinent medical history of diabetes, hypertension, and hyperlipidemia presented to the ED with chief complaint of syncope found to have urinary retention, leukocytosis WBC 14.4, CK elevated to ~11k and BUN/Cr 66/1.7 concerning for AKI. Patient was admitted for rhabdomyolysis and replacement of fluid as well as work-up for initial syncopal episode.  Pt admitted with syncope.   Reviewed I/O's: -478 ml x 24 hours  UOP: 2.6 L x 24 hours  Pt unavailable at time of visit. Attempted to speak with pt via call to hospital room phone, however, unable to reach. RD unable to obtain further nutrition-related history or complete nutrition-focused physical exam at this time.    No meal completion data available at this time.   Reviewed wt hx; pt has experienced 3.8% wt loss over the past 4 months, which is not significant for time frame.   Medications reviewed and include 0.9% sodium chloride infusion @ 200 ml/hr.   Lab Results  Component Value Date   HGBA1C 5.9 (H) 03/27/2021   PTA DM medications are 10 mg glipizide daily.   Labs reviewed: Na: 131, CBGS: 90-212 (inpatient orders for glycemic control are 0-9 units insulin aspart TID with meals and 2 units insulin aspart TID with meals).    Diet Order:   Diet Order              Diet Carb Modified Fluid consistency: Thin; Room service appropriate? Yes  Diet effective now                   EDUCATION NEEDS:   No education needs have been identified at this time  Skin:  Skin Assessment: Skin Integrity Issues: Skin Integrity Issues:: Stage II Stage II: sacrum  Last BM:  Unknown  Height:   Ht Readings from Last 1 Encounters:  03/27/21 5\' 5"  (1.651 m)    Weight:   Wt Readings from Last 1 Encounters:  03/27/21 94.1 kg    Ideal Body Weight:  56.8 kg  BMI:  Body mass index is 34.52 kg/m.  Estimated Nutritional Needs:   Kcal:  1700-1900  Protein:  85-100 grams  Fluid:  > 1.7 L    Loistine Chance, RD, LDN, [place] Registered Dietitian II Certified Diabetes Care and Education Specialist Please refer to [facility] for RD and/or RD on-call/weekend/after hours pager

## 2021-03-28 NOTE — Progress Notes (Signed)
PROGRESS NOTE    Henry [name]  RJJ:[id number] DOB: Sep 06, 1946 DOA: 03/27/2021 PCP: Celene Squibb, MD   Brief Hospital Course:  Henry [name] is a 75 y.o. male with medical history significant of hypertension, hyperlipidemia, diabetes came in with chief complaint of fall and decreased PO intake of 3 day duration before presenting to the ED.  He lives by himself and does not volunteer any contact information. Patient confirmed his code status to be DO NOT RESUSCITATE. Denies any history of stroke, seizure, or cardiac diseases and similar episode in the past. Patient was found to have CK ~11k and myoglobinuria consistent with rhabdomyolysis, for which he was admitted to [facility].   In the ED, Patient was hemodynamically stable with BP 129/58, RR 16, pulse 64, Temp 98.3. Work up was significant for leukocytosis WBC 14.4, anemia Hgb 12.0, CK elevated up to ~11k, though neg troponin x2, Na 124, BUN/Cr 66/1.72 consistent with pre-renal etiology and volume down, elevated LFTs (AST/ALT 321/100). Renal US found bladder full of >1L urine. Urinary catheter was placed with good UOP. Urine analysis unremarkable for UTI though with large amount of blood and protein. Utox negative for everything. CT head showed no evidence of CVA. KZS-01 showed sinus rhythm, regular rate (66bpm). Patient received 2L LR bolus, and another 2L maintenance fluid is administered over the next 8 hours.   He was tested positive for COVID-19 infection during routine screening, though he was asymptomatic. Patient agreed to monoclonal antibody for the COVID-19 infection and expect to receive treatment at noon today. Patient received IVF and continued to have increased PO intake. His CK trended down to 3k on 7/7. Physical therapy evaluated patient during this hospitalization; recommendations pending.   Of note, patient is retired from Nutritional therapist since 2007. He spends his days gardening and working out.   Assessment & Plan:   Principal Problem:    Rhabdomyolysis Active Problems:   Personal history of colonic polyps   Hypertension   Hyponatremia   Hyperlipidemia   Type 2 diabetes mellitus (HCC) with noted hyperglycemia   Elevated CK   Elevated LFTs   Acute kidney injury ([place])   Urinary retention   Anemia   Leukocytosis   Do not resuscitate   Pressure injury of skin  76 yo male with pertinent medical history of diabetes, hypertension, and hyperlipidemia presented to the ED with chief complaint of syncope found to have urinary retention, leukocytosis WBC 14.4, CK elevated to ~11k and BUN/Cr 66/1.7 concerning for AKI. Patient was admitted for rhabdomyolysis and replacement of fluid as well as work-up for initial syncopal episode. Patient is doing well today with much improved CK, LFTs, and creatinine.    Question of Syncope / Fall at home - unable to obtain much history. Etiologies include infection in setting of leukocytosis WBC 14.4 vs vasovagal vs seizure. Stroke and cardiac etiologies less likely given negative CT head and reassuring EKG. - CXR 7/6 negative for local infiltrates, pleural effusion - UA 7/6 negative for infection and only remarkable for myoglobinuria (consistent with rhabdo) - U tox negative for all - Continue to monitor - PT evaluated patient 7/6; recommends acute SNF, but will re-evaluate today given improved condition.  - F/u with PCP outpatient   Rhabdomyolysis - in setting of dehydration, limited PO intake. CK ~3k from 11k, AST/ALT trending down 165/70 from 321/100. Creatinine normalized to 0.97 (from 1.7) - Much improved with rehydration - F/u with PCP outpatient    3. Leukocytosis - RESOLVED. WBC normalized to 9.7  from 14.4;  in setting of dehydration, syncope, rhabdomyolysis. Infectious workup including CXR and U/A unremarkable for pneumonia or UTI. COVID-19 screen POSITIVE. Repeated CXR 7/7 AM was negative for cardiopulmonary findings.  - Daily CBC   4. Elevated LFTs - AST 165, ALT 70 down from 321 and  100;  in setting of rhabdomyolysis. AST/ALT have been found to be elevated in patients with rhabdo. Patient with reported history of alcohol use, though not high/unusual amount (7-12 beers/week). - Trend LFTs with daily CMP  5. Acute kidney injury - BUN/Cr ratio >20:1, suggestive of pre-renal etiology. Most likely secondary to dehydration and rhabdomyolysis - 2L IV LR bolus given - Continue IV NS 241ml/hr - Trend creatinine with daily CMP - Daily I&Os   6. Anemia - Hgb 10.7 on 7/7, down from 12.0. Previous values from years ago appear to be 15-16. Had history of rectal bleeding last year and follow up with GI, thought to be hemorrhoids. Per GI visit with Dr. 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 - Trend with daily CBC - FOLLOW UP ANEMIA PANEL   7. Hyponatremia - Na up at 131 from 124. In setting of hypovolemia and acute kidney injury. FOLLOW CLOSELY WITH DAILY CMP   8. T2DM - controlled with glipizide and metformin at home. Last A1c 2018 was 5.6. Blood glucose was 249 on admission despite much PO intake. Last fingerstick was 90 - HOLD home glipizide and metformin - F/u HgB A1c - SSI   9. Hypertension - Hold home HCTZ in setting of hypovolemia and soft diastolic pressure (631S-970Y/OVZC 50s) - IV meds ordered for elevated BP readings.   DNR - present on admission, confirmed with patient, continue DNR order while in hospital.     ACUTE URINARY RETENTION 7/6 - BLADDER SCAN WITH >1000 CC , FOLEY PLACED FOR BLADDER DECOMPRESSION, - FOLEY REMOVED AND VOIDING TRIAL TODAY 7/7  - PT REPORTS THAT HE DOES NOT NORMALLY HAVE DIFFICULTY URINATING.      ASYMPTOMATIC COVID INFECTION -PT IS HIGH RISK FOR COVID COMPLICATIONS GIVEN ADVANCED AGE, IMMUNOCOMPROMISED STATE. - TREATING WITH MONOCLONAL ANTIBODY TREATMENT X 1 DOSE AND SUPPORTIVE MEASURES.   - FOLLOW CLINICALLY.      DVT prophylaxis: :Lovenox SQ Code Status: Do NOT  RESUSCITATE Family Communication: none, patient lives alone and indicates that his care is communicated to him only. Disposition Plan: home with HHPT   Consultants:  PT   Procedures:  Foley catheter placed due to urinary retention (7/6 - removed 7/7)  Antimicrobials: none    Subjective: Patient was evaluated at bedside. States he is doing much better today; feeling less weak. Would prefer to not go to acute rehab per PT recommendations. We let him know that PT will evaluate him again today to see if he will be okay with just home health PT.   Objective: Vitals:   03/27/21 1330 03/27/21 1700 03/27/21 1957 03/28/21 0408  BP: (!) 154/61 (!) 148/60 (!) 111/50 (!) 118/53  Pulse: 74 75 66 66  Resp: 18 18 19 19   Temp: 98.1 F (36.7 C) 98.6 F (37 C) 98.2 F (36.8 C) 98.3 F (36.8 C)  TempSrc: Oral Oral    SpO2: 100% 100% 100% 100%  Weight: 94.1 kg     Height: 5\' 5"  (1.651 m)       Intake/Output Summary (Last 24 hours) at 03/28/2021 1020 Last data filed at 03/28/2021 0500 Gross per 24 hour  Intake 2121.91 ml  Output 1000  ml  Net 1121.91 ml   Filed Weights   03/27/21 0639 03/27/21 1330  Weight: 94.3 kg 94.1 kg    Examination:  General exam: Appears calm and comfortable  Respiratory system: Clear to auscultation. Respiratory effort normal. Cardiovascular system: S1 & S2 heard, RRR. No JVD, murmurs, rubs, gallops or clicks. No pedal edema. Gastrointestinal system: Abdomen is nondistended, soft and nontender. No organomegaly or masses felt. Normal bowel sounds heard. Central nervous system: Alert and oriented. No focal neurological deficits. Sensation intact, DTR normal. Able to sit up himself, improved from yesterday.  Extremities: warm, well-perfused extremities  Skin: No rashes, lesions or ulcers Psychiatry: Poor judgement and insight. Alert and oriented x3. Affect incongruent with situation - patient at times giggle/sigh from discussion with care team   Data Reviewed: I  have personally reviewed following labs and imaging studies  CBC: Recent Labs  Lab 03/27/21 0729 03/28/21 0517  WBC 14.4* 9.7  NEUTROABS 12.2* 7.6  HGB 12.0* 10.7*  HCT 34.9* 32.3*  MCV 92.8 95.6  PLT 321 671   Basic Metabolic Panel: Recent Labs  Lab 03/27/21 0729 03/28/21 0517  NA 124* 131*  K 4.8 4.5  CL 87* 97*  CO2 27 25  GLUCOSE 249* 104*  BUN 66* 31*  CREATININE 1.72* 0.94  CALCIUM 8.4* 8.1*  MG 2.9* 2.2  PHOS 4.2 2.5   GFR: Estimated Creatinine Clearance: 71.5 mL/min (by C-G formula based on SCr of 0.94 mg/dL). Liver Function Tests: Recent Labs  Lab 03/27/21 0729 03/28/21 0517  AST 321* 165*  ALT 100* 70*  ALKPHOS 63 51  BILITOT 1.1 0.9  PROT 7.8 6.1*  ALBUMIN 3.9 3.1*   No results for input(s): LIPASE, AMYLASE in the last 168 hours. No results for input(s): AMMONIA in the last 168 hours. Coagulation Profile: Recent Labs  Lab 03/27/21 0729  INR 1.2   Cardiac Enzymes: Recent Labs  Lab 03/27/21 0729 03/28/21 0517  CKTOTAL 11,462* 3,371*   BNP (last 3 results) No results for input(s): PROBNP in the last 8760 hours. HbA1C: Recent Labs    03/27/21 0729  HGBA1C 5.9*   CBG: Recent Labs  Lab 03/27/21 1137 03/27/21 1612 03/27/21 2000 03/28/21 0352 03/28/21 0719  GLUCAP 149* 152* 212* 90 90   Lipid Profile: No results for input(s): CHOL, HDL, LDLCALC, TRIG, CHOLHDL, LDLDIRECT in the last 72 hours. Thyroid Function Tests: No results for input(s): TSH, T4TOTAL, FREET4, T3FREE, THYROIDAB in the last 72 hours. Anemia Panel: Recent Labs    03/27/21 0729  VITAMINB12 428  FOLATE 9.5  FERRITIN 284  TIBC 286  IRON 48  RETICCTPCT 1.3   Urine analysis:    Component Value Date/Time   COLORURINE YELLOW 03/27/2021 0730   APPEARANCEUR HAZY (A) 03/27/2021 0730   LABSPEC 1.015 03/27/2021 0730   PHURINE 5.0 03/27/2021 0730   GLUCOSEU NEGATIVE 03/27/2021 0730   HGBUR LARGE (A) 03/27/2021 0730   BILIRUBINUR NEGATIVE 03/27/2021 0730    KETONESUR NEGATIVE 03/27/2021 0730   PROTEINUR 30 (A) 03/27/2021 0730   NITRITE NEGATIVE 03/27/2021 0730   LEUKOCYTESUR NEGATIVE 03/27/2021 0730   Sepsis Labs: @LABRCNTIP (procalcitonin:4,lacticidven:4)  ) Recent Results (from the past 240 hour(s))  Resp Panel by RT-PCR (Flu A&B, Covid) Nasopharyngeal Swab     Status: Abnormal   Collection Time: 03/27/21 12:40 PM   Specimen: Nasopharyngeal Swab; Nasopharyngeal(NP) swabs in vial transport medium  Result Value Ref Range Status   SARS Coronavirus 2 by RT PCR POSITIVE (A) NEGATIVE Final    Comment: RESULT  CALLED TO, READ BACK BY AND VERIFIED WITH: HARRIS,M ON 03/27/21 AT 1540 BY LOY,C (NOTE) SARS-CoV-2 target nucleic acids are DETECTED.  The SARS-CoV-2 RNA is generally detectable in upper respiratory specimens during the acute phase of infection. Positive results are indicative of the presence of the identified virus, but do not rule out bacterial infection or co-infection with other pathogens not detected by the test. Clinical correlation with patient history and other diagnostic information is necessary to determine patient infection status. The expected result is Negative.  Fact Sheet for Patients: EntrepreneurPulse.com.au  Fact Sheet for Healthcare Providers: IncredibleEmployment.be  This test is not yet approved or cleared by the [place] FDA and  has been authorized for detection and/or diagnosis of SARS-CoV-2 by FDA under an Emergency Use Authorization (EUA).  This EUA will remain in effect (meaning this test can  be used) for the duration of  the COVID-19 declaration under Section 564(b)(1) of the Act, 21 U.S.C. section 360bbb-3(b)(1), unless the authorization is terminated or revoked sooner.     Influenza A by PCR NEGATIVE NEGATIVE Final   Influenza B by PCR NEGATIVE NEGATIVE Final    Comment: (NOTE) The Xpert Xpress SARS-CoV-2/FLU/RSV plus assay is intended as an aid in the  diagnosis of influenza from Nasopharyngeal swab specimens and should not be used as a sole basis for treatment. Nasal washings and aspirates are unacceptable for Xpert Xpress SARS-CoV-2/FLU/RSV testing.  Fact Sheet for Patients: EntrepreneurPulse.com.au  Fact Sheet for Healthcare Providers: IncredibleEmployment.be  This test is not yet approved or cleared by the [place] FDA and has been authorized for detection and/or diagnosis of SARS-CoV-2 by FDA under an Emergency Use Authorization (EUA). This EUA will remain in effect (meaning this test can be used) for the duration of the COVID-19 declaration under Section 564(b)(1) of the Act, 21 U.S.C. section 360bbb-3(b)(1), unless the authorization is terminated or revoked.  Performed at [facility], [address] [place], [place] [address]          Radiology Studies: CT Head Wo Contrast  Result Date: 03/27/2021 CLINICAL DATA:  75 year old male with history of trauma from a fall out of a chair. EXAM: CT HEAD WITHOUT CONTRAST TECHNIQUE: Contiguous axial images were obtained from the base of the skull through the vertex without intravenous contrast. COMPARISON:  No priors. FINDINGS: Brain: Mild cerebral atrophy. Patchy and confluent areas of decreased attenuation are noted throughout the deep and periventricular white matter of the cerebral hemispheres bilaterally, compatible with chronic microvascular ischemic disease. No evidence of acute infarction, hemorrhage, hydrocephalus, extra-axial collection or mass lesion/mass effect. Vascular: No hyperdense vessel or unexpected calcification. Skull: Normal. Negative for fracture or focal lesion. Sinuses/Orbits: No acute finding. Other: Right middle ear and mastoid effusion. IMPRESSION: 1. No acute intracranial abnormalities. 2. Right middle ear and mastoid effusion. 3. Mild cerebral atrophy with chronic microvascular ischemic changes in the cerebral white  matter, as above. Electronically Signed   By: Vinnie Langton M.D.   On: 03/27/2021 08:24   US Renal  Result Date: 03/27/2021 CLINICAL DATA:  Urinary retention.  Acute kidney injury. EXAM: RENAL / URINARY TRACT ULTRASOUND COMPLETE COMPARISON:  None. FINDINGS: Right Kidney: Renal measurements: 11.4 x 5.3 x 6.5 cm = volume: 208.3 mL. Echogenicity within normal limits. No mass or hydronephrosis visualized. Left Kidney: Renal measurements: 10.6 x 5.8 x 5.1 cm = volume: 162.9 mL. Echogenicity within normal limits. Simple 2.2 cm cyst upper pole noted. No solid mass or hydronephrosis visualized. Bladder: Completely decompressed with a Foley  catheter in place. Other: None. IMPRESSION: No acute abnormality.  Negative for hydronephrosis. 2.2 cm left renal cyst noted. Electronically Signed   By: Inge Rise M.D.   On: 03/27/2021 10:51   DG CHEST PORT 1 VIEW  Result Date: 03/28/2021 CLINICAL DATA:  COVID infection and leukocytosis, cough. EXAM: PORTABLE CHEST 1 VIEW COMPARISON:  March 27, 2021. FINDINGS: Lung volumes are low normal. Cardiomediastinal contours and hilar structures are stable. No sign of consolidation or sign of pleural effusion on frontal radiograph. On limited assessment no acute skeletal process. IMPRESSION: No active cardiopulmonary disease. Electronically Signed   By: Zetta Bills M.D.   On: 03/28/2021 08:13   DG CHEST PORT 1 VIEW  Result Date: 03/27/2021 CLINICAL DATA:  History of fall.  Weakness. EXAM: PORTABLE CHEST 1 VIEW COMPARISON:  No prior. FINDINGS: Cardiomegaly. No pulmonary venous congestion. No focal infiltrate. No pleural effusion or pneumothorax. Degenerative changes scoliosis thoracic spine. Degenerative changes both shoulders no displaced rib fracture. IMPRESSION: 1. Cardiomegaly. No pulmonary venous congestion. No acute pulmonary disease. 2.  No evidence of displaced rib fracture or pneumothorax. Electronically Signed   By: Marcello Moores  Register   On: 03/27/2021 11:10      Scheduled Meds:  bebtelovimab EUA  175 mg Intravenous Once   Chlorhexidine Gluconate Cloth  6 each Topical Daily   enoxaparin (LOVENOX) injection  40 mg Subcutaneous Daily   insulin aspart  0-9 Units Subcutaneous TID WC   insulin aspart  2 Units Subcutaneous TID WC   levothyroxine  88 mcg Oral Q0600   simvastatin  10 mg Oral q1800   Continuous Infusions:  sodium chloride 200 mL/hr at 03/28/21 0937   sodium chloride     famotidine (PEPCID) IV       LOS: 1 day    Time spent: 30 minutes  Fredrik Rigger, Medical student [facility] Pager [phone]  If 7PM-7AM, please contact night-coverage [email or web address] Password [email or web address] 03/28/2021, 10:20 AM  ________________________________________________________________________ ATTENDING NOTE  Patient seen and examined with Fredrik Rigger, Medical student. In addition to supervising the encounter, I played a key role in the decision making process as well as reviewed key findings. Pt requested a new PT eval today, he remains SNF eligible.  Likely could discharge to SNF 7/8.    Murvin Natal, MD  Attending Physician [facility] How to contact the [facility] Attending or Consulting provider [place] or covering provider during after hours [place], for this patient?  Check the care team in [facility] and look for a) attending/consulting TR[facility] provider listed and b) the [facility] team listed Log into [email or web address] and use [place]'s universal password to access. If you do not have the password, please contact the hospital operator. Locate the [facility] provider you are looking for under [facility] and page to a number that you can be directly reached. If you still have difficulty reaching the provider, please page the [facility] (Director on Call) for the Hospitalists listed on amion for assistance.

## 2021-03-28 NOTE — Progress Notes (Signed)
Physical Therapy Treatment Patient Details Name: Henry [name] MRN: [id number] DOB: 05-23-1946 Today's Date: 03/28/2021    History of Present Illness Henry [name] is a 75 y.o. male with medical history significant of hypertension, hyperlipidemia, diabetes came in with chief complaint of fall. Patient brought here by EMS. Reports that he fell on Sunday afternoon while watching TV. He lives by himself and does not volunteer any contact information. He hydrated himself with apple juice. It did NOT occur to him to call EMS. This morning he called one of his friends, who went to check on him and called EMS as patient was unable to get up.    PT Comments    Patient demonstrates increased BUE strength for bed mobility during supine<>sitting with use of bed rail, increased endurance/distance for gait training with slow labored cadence, no loss of balance, but frequent standing rest breaks due to tightness behind legs per patient.  Patient tolerated sitting up in chair after therapy - nursing staff aware.  Patient will benefit from continued physical therapy in hospital and recommended venue below to increase strength, balance, endurance for safe ADLs and gait.     Follow Up Recommendations  SNF;Supervision for mobility/OOB;Supervision - Intermittent     Equipment Recommendations  Rolling walker with 5" wheels    Recommendations for Other Services       Precautions / Restrictions Precautions Precautions: Fall Restrictions Weight Bearing Restrictions: No    Mobility  Bed Mobility Overal bed mobility: Needs Assistance Bed Mobility: Sidelying to Sit;Supine to Sit;Sit to Supine     Supine to sit: Min guard Sit to supine: Min guard   General bed mobility comments: demonstrates improvement for using BUE for supine to sitting, sitting to supine    Transfers Overall transfer level: Needs assistance Equipment used: Rolling walker (2 wheeled);None;1 person hand held assist Transfers: Sit to/from  [facility] Sit to Stand: Min assist Stand pivot transfers: Min assist       General transfer comment: very unsteady on feet and unable to maintain standing balance without leaning on bedrail or using RW  Ambulation/Gait Ambulation/Gait assistance: Min guard;Min assist Gait Distance (Feet): 65 Feet Assistive device: Rolling walker (2 wheeled) Gait Pattern/deviations: Decreased step length - right;Decreased step length - left;Decreased stride length Gait velocity: decreased   General Gait Details: demonstrates increased endurance/distance for gait training with slow labored cadence, no loss of balance, limited mostly due to fatigue   Stairs             Wheelchair Mobility    Modified Rankin (Stroke Patients Only)       Balance Overall balance assessment: Needs assistance Sitting-balance support: Feet supported;No upper extremity supported Sitting balance-Leahy Scale: Good Sitting balance - Comments: seated at EOB   Standing balance support: During functional activity;No upper extremity supported Standing balance-Leahy Scale: Poor Standing balance comment: fair using RW                            Cognition Arousal/Alertness: Awake/alert Behavior During Therapy: WFL for tasks assessed/performed Overall Cognitive Status: Within Functional Limits for tasks assessed                                        Exercises General Exercises - Lower Extremity Long Arc Quad: Seated;AROM;Strengthening;Both;10 reps Hip Flexion/Marching: Seated;AROM;Strengthening;Both;10 reps Toe Raises: Seated;AROM;Strengthening;Both;10 reps Heel Raises: Seated;AROM;Strengthening;Both;10 reps  General Comments        Pertinent Vitals/Pain Pain Assessment: Faces Faces Pain Scale: Hurts a little bit Pain Location: over buttocks Pain Descriptors / Indicators: Discomfort;Sore Pain Intervention(s): Limited activity within patient's  tolerance;Monitored during session;Repositioned    Home Living                      Prior Function            PT Goals (current goals can now be found in the care plan section) Acute Rehab PT Goals Patient Stated Goal: return home after rehab PT Goal Formulation: With patient Time For Goal Achievement: 04/10/21 Potential to Achieve Goals: Good Progress towards PT goals: Progressing toward goals    Frequency    Min 3X/week      PT Plan Current plan remains appropriate    Co-evaluation              AM-PAC PT "6 Clicks" Mobility   Outcome Measure  Help needed turning from your back to your side while in a flat bed without using bedrails?: None Help needed moving from lying on your back to sitting on the side of a flat bed without using bedrails?: A Little Help needed moving to and from a bed to a chair (including a wheelchair)?: A Little Help needed standing up from a chair using your arms (e.g., wheelchair or bedside chair)?: A Lot Help needed to walk in hospital room?: A Little Help needed climbing 3-5 steps with a railing? : A Lot 6 Click Score: 17    End of Session   Activity Tolerance: Patient tolerated treatment well;Patient limited by fatigue Patient left: in chair;with call bell/phone within reach;with chair alarm set Nurse Communication: Mobility status PT Visit Diagnosis: Unsteadiness on feet (R26.81);Other abnormalities of gait and mobility (R26.89);Muscle weakness (generalized) (M62.81)     Time: 3606-7703 PT Time Calculation (min) (ACUTE ONLY): 37 min  Charges:  $Gait Training: 8-22 mins $Therapeutic Exercise: 8-22 mins                     11:52 AM, 03/28/21 Lonell Grandchild, MPT Physical Therapist with [facility] 336 [phone] office [phone] mobile phone

## 2021-03-28 NOTE — TOC Initial Note (Signed)
Transition of Care [facility]) - Initial/Assessment Note    Patient Details  Name: Henry [name] MRN: [id number] Date of Birth: March 28, 1946  Transition of Care [facility]) CM/SW Contact:    Salome Arnt, [place] Phone Number: 03/28/2021, 1:27 PM  Clinical Narrative:  Pt admitted due to fall at home and was found to be COVID +. Pt states he lives alone and usually manages well. PT evaluated pt and recommend SNF. Discussed with pt who wanted PT to see him again today. Upon reassessment, pt has improved. He plans to return home with home health. Discussed with pt who is agreeable to [facility]. Cindie with [facility] accepts and they can see pt on Saturday. Pt's friends, Konrad Dolores, [place], and [place] plan to help pt at home. LCSW spoke with Meryl and Tommy to confirm. They report Sherren Mocha will pick up pt tomorrow. They are aware pt is COVID +. TOC will follow.                  Expected Discharge Plan: [place] Barriers to Discharge: Continued Medical Work up   Patient Goals and CMS Choice Patient states their goals for this hospitalization and ongoing recovery are:: return home   Choice offered to / list presented to : Patient  Expected Discharge Plan and Services Expected Discharge Plan: [place] In-house Referral: Clinical Social Work   Post Acute Care Choice: [place] arrangements for the past 2 months: [place]: PT [place]: [place] Date [place]: 03/28/21 Time [place]: 1324 Representative spoke with at [place]: Cindie  Prior Living Arrangements/Services Living arrangements for the past 2 months: [place] with:: Self Patient language and need for interpreter reviewed:: Yes Do you feel safe going back to the place where you live?: Yes      Need for Family Participation in Patient Care: Yes (Comment) Care giver support system in place?: Yes  (comment)   Criminal Activity/Legal Involvement Pertinent to Current Situation/Hospitalization: No - Comment as needed  Activities of Daily Living Home Assistive Devices/Equipment: None ADL Screening (condition at time of admission) Patient's cognitive ability adequate to safely complete daily activities?: Yes Is the patient deaf or have difficulty hearing?: No Does the patient have difficulty seeing, even when wearing glasses/contacts?: No Does the patient have difficulty concentrating, remembering, or making decisions?: No Patient able to express need for assistance with ADLs?: Yes Does the patient have difficulty dressing or bathing?: No Independently performs ADLs?: Yes (appropriate for developmental age) Does the patient have difficulty walking or climbing stairs?: No Weakness of Legs: None Weakness of Arms/Hands: None  Permission Sought/Granted         Permission granted to share info w AGENCY: [place] granted to share info w Relationship: Home health     Emotional Assessment   Attitude/Demeanor/Rapport: Engaged Affect (typically observed): Accepting Orientation: : Oriented to Self, Oriented to Place, Oriented to  Time, Oriented to Situation Alcohol / Substance Use: Not Applicable Psych Involvement: No (comment)  Admission diagnosis:  Rhabdomyolysis [M62.82] Urinary retention [R33.9] Leukocytosis [D72.829] AKI (acute kidney injury) ([place]) [N17.9] Traumatic rhabdomyolysis, initial encounter ([place]) [T79.6XXA] Patient Active Problem List   Diagnosis Date Noted   Pressure injury of skin 03/28/2021   Rhabdomyolysis 03/27/2021   Hypertension 03/27/2021  Hyponatremia 03/27/2021   Hyperlipidemia 03/27/2021   Type 2 diabetes mellitus ([place]) with noted hyperglycemia 03/27/2021   Elevated CK 03/27/2021   Elevated LFTs 03/27/2021   Acute kidney injury ([place]) 03/27/2021   Urinary retention 03/27/2021   Anemia 03/27/2021   Leukocytosis 03/27/2021   Do not  resuscitate 03/27/2021   Constipation 08/02/2020   Rectal bleeding 08/02/2020   Personal history of colonic polyps    PCP:  Celene Squibb, MD Pharmacy:   [id number] - [place], [place] [facility] [address] [address] Phone: [phone] Fax: [phone]     Social Determinants of Health (SDOH) Interventions    Readmission Risk Interventions No flowsheet data found.

## 2021-03-29 LAB — CBC WITH DIFFERENTIAL/PLATELET
Abs Immature Granulocytes: 0.1 10*3/uL — ABNORMAL HIGH (ref 0.00–0.07)
Basophils Absolute: 0 10*3/uL (ref 0.0–0.1)
Basophils Relative: 1 %
Eosinophils Absolute: 0.2 10*3/uL (ref 0.0–0.5)
Eosinophils Relative: 3 %
HCT: 30.4 % — ABNORMAL LOW (ref 39.0–52.0)
Hemoglobin: 10.1 g/dL — ABNORMAL LOW (ref 13.0–17.0)
Immature Granulocytes: 1 %
Lymphocytes Relative: 12 %
Lymphs Abs: 0.9 10*3/uL (ref 0.7–4.0)
MCH: 31.9 pg (ref 26.0–34.0)
MCHC: 33.2 g/dL (ref 30.0–36.0)
MCV: 95.9 fL (ref 80.0–100.0)
Monocytes Absolute: 1 10*3/uL (ref 0.1–1.0)
Monocytes Relative: 14 %
Neutro Abs: 4.9 10*3/uL (ref 1.7–7.7)
Neutrophils Relative %: 69 %
Platelets: 302 10*3/uL (ref 150–400)
RBC: 3.17 MIL/uL — ABNORMAL LOW (ref 4.22–5.81)
RDW: 12.7 % (ref 11.5–15.5)
WBC: 7.1 10*3/uL (ref 4.0–10.5)
nRBC: 0 % (ref 0.0–0.2)

## 2021-03-29 LAB — COMPREHENSIVE METABOLIC PANEL
ALT: 75 U/L — ABNORMAL HIGH (ref 0–44)
AST: 134 U/L — ABNORMAL HIGH (ref 15–41)
Albumin: 2.8 g/dL — ABNORMAL LOW (ref 3.5–5.0)
Alkaline Phosphatase: 53 U/L (ref 38–126)
Anion gap: 2 — ABNORMAL LOW (ref 5–15)
BUN: 15 mg/dL (ref 8–23)
CO2: 27 mmol/L (ref 22–32)
Calcium: 7.6 mg/dL — ABNORMAL LOW (ref 8.9–10.3)
Chloride: 102 mmol/L (ref 98–111)
Creatinine, Ser: 0.66 mg/dL (ref 0.61–1.24)
GFR, Estimated: >60 mL/min (ref >60)
Glucose, Bld: 99 mg/dL (ref 70–99)
Potassium: 4.1 mmol/L (ref 3.5–5.1)
Sodium: 131 mmol/L — ABNORMAL LOW (ref 135–145)
Total Bilirubin: 0.8 mg/dL (ref 0.3–1.2)
Total Protein: 5.9 g/dL — ABNORMAL LOW (ref 6.5–8.1)

## 2021-03-29 LAB — GLUCOSE, CAPILLARY
Glucose-Capillary: 83 mg/dL (ref 70–99)
Glucose-Capillary: 94 mg/dL (ref 70–99)
Glucose-Capillary: 133 mg/dL — ABNORMAL HIGH (ref 70–99)

## 2021-03-29 LAB — CK: Total CK: 2113 U/L — ABNORMAL HIGH (ref 49–397)

## 2021-03-29 MED ORDER — SIMVASTATIN 40 MG PO TABS: 40.0000 mg | ORAL_TABLET | Freq: Every day | ORAL | Status: AC

## 2021-03-29 MED ORDER — LOSARTAN POTASSIUM 50 MG PO TABS
50.0000 mg | ORAL_TABLET | Freq: Every day | ORAL | Status: DC
  Administered 2021-03-29: 50 mg via ORAL
  Filled 2021-03-29: qty 1

## 2021-03-29 NOTE — Discharge Summary (Signed)
Physician Discharge Summary  Henry [name] MHD:[id number] DOB: Nov 10, 1945 DOA: 03/27/2021  PCP: Celene Squibb, MD  Admit date: 03/27/2021  Discharge date: 03/29/2021  Admitted From: home  Disposition:  home with HHPT (PT REFUSED SNF)  Recommendations for Outpatient Follow-up:  Follow up with PCP in 1-2 weeks Please follow up/work up syncope, urinary retention Please obtain CBC and CMP in one week with special attention to LFTs, Cr, and anemia Please remind patient to hydrate with at least 2L of fluid (non-sugary beverages) daily Please follow up COVID infection  Home Health: home health PT  Equipment/Devices: rolling walker with 5'' wheels  Discharge Condition:Stable  CODE STATUS: Full  Diet recommendation: Carbohydrate-modifying diet for diabetes  Brief/Interim Summary: Henry [name] is a 75 y.o. male with medical history significant of hypertension, hyperlipidemia, diabetes came in with chief complaint of fall and decreased PO intake of 3 day duration before presenting to the ED.  He lives by himself and does not volunteer any contact information. Patient confirmed his code status to be DO NOT RESUSCITATE. Denies any history of stroke, seizure, or cardiac diseases and similar episode in the past. Patient was found to have CK ~11k and myoglobinuria consistent with rhabdomyolysis, for which he was admitted to [facility].   In the ED, Patient was hemodynamically stable with BP 129/58, RR 16, pulse 64, Temp 98.3. Work up was significant for leukocytosis WBC 14.4, anemia Hgb 12.0, CK elevated up to ~11k, though neg troponin x2, Na 124, BUN/Cr 66/1.72 consistent with pre-renal etiology and volume down, elevated LFTs (AST/ALT 321/100). Renal US found bladder full of >1L urine. Urinary catheter was placed with good UOP. Urine analysis unremarkable for UTI though with large amount of blood and protein. Utox negative for everything. CT head showed no evidence of CVA. QJJ-94 showed sinus rhythm, regular  rate (66bpm). Patient received 2L LR bolus, and another 2L maintenance fluid is administered over the next 8 hours.    He was tested positive for COVID-19 infection during routine screening, though he was asymptomatic. Given his comorbidities (DM, HTN, old age), monoclonal antibody for COVID-19 was offered, and patient was agreeable to treatment, which was administered 7/7. Patient received IVF and continued to have increased PO intake. His CK trended down to 2k upon discharge. AKI has resolved with Creatinine back to baseline. LFTs downtrending to baseline though still elevated. Physical therapy evaluated patient during this hospitalization; recommends SNF over home health PT, and patient was adamant about resuming with HHPT. Patient lives alone but will have his friend Sherren Mocha to help. He was discharged with continuation of home meds except for statin (due to raised CK) and glipizide (as his A1c is 5.9 and he's already on metformin 500mg  )   Of note, patient is retired from Nutritional therapist since 2007. He spends his days gardening and working out.   Discharge Diagnoses:  Principal Problem:   Rhabdomyolysis Active Problems:   Personal history of colonic polyps   Hypertension   Hyponatremia   Hyperlipidemia   Type 2 diabetes mellitus (HCC) with noted hyperglycemia   Elevated CK   Elevated LFTs   Acute kidney injury ([place])   Urinary retention   Anemia   Leukocytosis   Do not resuscitate   Pressure injury of skin  Principal diagnosis:   Discharge Instructions - Please follow up with your PCP to work up causes for your fall - Please drink lots of fluid given your diagnosis of rhabdomyolysis (muscle breakdown due to dehydration) and kidney injury. Alert your  friends or call 911 if similar symptoms (fall, weakness, can't move your bladder) occur - You were diagnosed with Sars-CoV-2 infection during this hospitalization, please monitor symptoms and self-isolate at home. You have received a dose of  bebtelovimab monoclonal antibody for Sars-CoV-2 infection. - Please resume your home medications including:  --- hydrochlorothiazide 12.5mg  by mouth daily and telmisartan 40mg  by mouth daily for hypertension --- metformin 500mg  by mouth twice daily for diabetes (your last A1c on 7/6 is 5.9). We have discontinued glipizide given its side effect profile and that you're covered by metformin for diabetes --- Hold simvastatin as it can worsen rhabdomyolysis. Please follow up with PCP regarding continuing for lowering cholesterol.  - Full details of discharge instruction are included in the package. You can find more information on rhabdomyolysis, acute urinary retention, and Sars-CoV-2 infection there.  Discharge Instructions     Temperature monitoring   Complete by: Mar 29, 2021    After how many days would you like to receive a notification of this patient's flowsheet entries?: 1   MyChart COVID-19 home monitoring program   Complete by: Apr 05, 2021    Is the patient willing to use the [place] for home monitoring?: Yes      Allergies as of 03/29/2021   No Known Allergies      Medication List     STOP taking these medications    glipiZIDE 10 MG tablet Commonly known as: GLUCOTROL   hydrochlorothiazide 12.5 MG capsule Commonly known as: MICROZIDE   naproxen sodium 220 MG tablet Commonly known as: ALEVE       TAKE these medications    CENTRUM ADULTS PO Take 1 tablet by mouth daily.   cetirizine 10 MG tablet Commonly known as: ZYRTEC Take 10 mg by mouth daily as needed for allergies.   Fish Oil 1200 MG Caps Take 2,400 mg by mouth daily.   levothyroxine 88 MCG tablet Commonly known as: SYNTHROID Take 88 mcg by mouth daily.   metFORMIN 500 MG tablet Commonly known as: GLUCOPHAGE Take 500mg  by mouth twice daily   simvastatin 40 MG tablet Commonly known as: ZOCOR Take 1 tablet (40 mg total) by mouth daily at 6 PM. Start taking on: April 05, 2021 What  changed:  See the new instructions. These instructions start on April 05, 2021. If you are unsure what to do until then, ask your doctor or other care provider.   telmisartan 40 MG tablet Commonly known as: MICARDIS Take 40 mg by mouth daily.               Durable Medical Equipment  (From admission, onward)           Start     Ordered   03/29/21 0951  For home use only DME Walker rolling  Once       Question Answer Comment  Walker: With 5 Inch Wheels   Patient needs a walker to treat with the following condition Rhabdomyolysis      03/29/21 0950            Follow-up Information     Care, [facility] Follow up.   Specialty: Home Health Services Why: Home health will contact you to schedule visits. Contact information: [address] [place] [address] [phone]         [facility], [facility] Follow up.   Why: Rolling walker. Contact information: [address] [place] [address] [phone]  No Known Allergies  Consultations: Physical therapy   Procedures/Studies: CT Head Wo Contrast  Result Date: 03/27/2021 CLINICAL DATA:  75 year old male with history of trauma from a fall out of a chair. EXAM: CT HEAD WITHOUT CONTRAST TECHNIQUE: Contiguous axial images were obtained from the base of the skull through the vertex without intravenous contrast. COMPARISON:  No priors. FINDINGS: Brain: Mild cerebral atrophy. Patchy and confluent areas of decreased attenuation are noted throughout the deep and periventricular white matter of the cerebral hemispheres bilaterally, compatible with chronic microvascular ischemic disease. No evidence of acute infarction, hemorrhage, hydrocephalus, extra-axial collection or mass lesion/mass effect. Vascular: No hyperdense vessel or unexpected calcification. Skull: Normal. Negative for fracture or focal lesion. Sinuses/Orbits: No acute finding. Other: Right middle  ear and mastoid effusion. IMPRESSION: 1. No acute intracranial abnormalities. 2. Right middle ear and mastoid effusion. 3. Mild cerebral atrophy with chronic microvascular ischemic changes in the cerebral white matter, as above. Electronically Signed   By: Vinnie Langton M.D.   On: 03/27/2021 08:24   US Renal  Result Date: 03/27/2021 CLINICAL DATA:  Urinary retention.  Acute kidney injury. EXAM: RENAL / URINARY TRACT ULTRASOUND COMPLETE COMPARISON:  None. FINDINGS: Right Kidney: Renal measurements: 11.4 x 5.3 x 6.5 cm = volume: 208.3 mL. Echogenicity within normal limits. No mass or hydronephrosis visualized. Left Kidney: Renal measurements: 10.6 x 5.8 x 5.1 cm = volume: 162.9 mL. Echogenicity within normal limits. Simple 2.2 cm cyst upper pole noted. No solid mass or hydronephrosis visualized. Bladder: Completely decompressed with a Foley catheter in place. Other: None. IMPRESSION: No acute abnormality.  Negative for hydronephrosis. 2.2 cm left renal cyst noted. Electronically Signed   By: Inge Rise M.D.   On: 03/27/2021 10:51   DG CHEST PORT 1 VIEW  Result Date: 03/28/2021 CLINICAL DATA:  COVID infection and leukocytosis, cough. EXAM: PORTABLE CHEST 1 VIEW COMPARISON:  March 27, 2021. FINDINGS: Lung volumes are low normal. Cardiomediastinal contours and hilar structures are stable. No sign of consolidation or sign of pleural effusion on frontal radiograph. On limited assessment no acute skeletal process. IMPRESSION: No active cardiopulmonary disease. Electronically Signed   By: Zetta Bills M.D.   On: 03/28/2021 08:13   DG CHEST PORT 1 VIEW  Result Date: 03/27/2021 CLINICAL DATA:  History of fall.  Weakness. EXAM: PORTABLE CHEST 1 VIEW COMPARISON:  No prior. FINDINGS: Cardiomegaly. No pulmonary venous congestion. No focal infiltrate. No pleural effusion or pneumothorax. Degenerative changes scoliosis thoracic spine. Degenerative changes both shoulders no displaced rib fracture. IMPRESSION: 1.  Cardiomegaly. No pulmonary venous congestion. No acute pulmonary disease. 2.  No evidence of displaced rib fracture or pneumothorax. Electronically Signed   By: Marcello Moores  Register   On: 03/27/2021 11:10     Discharge Exam: Vitals:   03/28/21 2100 03/29/21 0500  BP: (!) 144/55 (!) 154/61  Pulse: 66 76  Resp: 18 18  Temp: 98.2 F (36.8 C) 98.4 F (36.9 C)  SpO2: 100% 98%   Vitals:   03/28/21 0408 03/28/21 1358 03/28/21 2100 03/29/21 0500  BP: (!) 118/53 117/61 (!) 144/55 (!) 154/61  Pulse: 66 68 66 76  Resp: 19 18 18 18   Temp: 98.3 F (36.8 C) 98.4 F (36.9 C) 98.2 F (36.8 C) 98.4 F (36.9 C)  TempSrc:  Oral Oral Oral  SpO2: 100% 100% 100% 98%  Weight:      Height:        General: Pt is alert, awake, not in acute distress Cardiovascular: RRR, S1/S2 +,  no rubs, no gallops Respiratory: CTA bilaterally, no wheezing, no rhonchi Abdominal: Soft, NT, ND, bowel sounds + Extremities: no edema, no cyanosis, mild tenderness in muscles of thigh bilaterally  The results of significant diagnostics from this hospitalization (including imaging, microbiology, ancillary and laboratory) are listed below for reference.     Microbiology: Recent Results (from the past 240 hour(s))  Resp Panel by RT-PCR (Flu A&B, Covid) Nasopharyngeal Swab     Status: Abnormal   Collection Time: 03/27/21 12:40 PM   Specimen: Nasopharyngeal Swab; Nasopharyngeal(NP) swabs in vial transport medium  Result Value Ref Range Status   SARS Coronavirus 2 by RT PCR POSITIVE (A) NEGATIVE Final    Comment: RESULT CALLED TO, READ BACK BY AND VERIFIED WITH: HARRIS,M ON 03/27/21 AT 1540 BY LOY,C (NOTE) SARS-CoV-2 target nucleic acids are DETECTED.  The SARS-CoV-2 RNA is generally detectable in upper respiratory specimens during the acute phase of infection. Positive results are indicative of the presence of the identified virus, but do not rule out bacterial infection or co-infection with other pathogens not detected by  the test. Clinical correlation with patient history and other diagnostic information is necessary to determine patient infection status. The expected result is Negative.  Fact Sheet for Patients: EntrepreneurPulse.com.au  Fact Sheet for Healthcare Providers: IncredibleEmployment.be  This test is not yet approved or cleared by the [place] FDA and  has been authorized for detection and/or diagnosis of SARS-CoV-2 by FDA under an Emergency Use Authorization (EUA).  This EUA will remain in effect (meaning this test can  be used) for the duration of  the COVID-19 declaration under Section 564(b)(1) of the Act, 21 U.S.C. section 360bbb-3(b)(1), unless the authorization is terminated or revoked sooner.     Influenza A by PCR NEGATIVE NEGATIVE Final   Influenza B by PCR NEGATIVE NEGATIVE Final    Comment: (NOTE) The Xpert Xpress SARS-CoV-2/FLU/RSV plus assay is intended as an aid in the diagnosis of influenza from Nasopharyngeal swab specimens and should not be used as a sole basis for treatment. Nasal washings and aspirates are unacceptable for Xpert Xpress SARS-CoV-2/FLU/RSV testing.  Fact Sheet for Patients: EntrepreneurPulse.com.au  Fact Sheet for Healthcare Providers: IncredibleEmployment.be  This test is not yet approved or cleared by the [place] FDA and has been authorized for detection and/or diagnosis of SARS-CoV-2 by FDA under an Emergency Use Authorization (EUA). This EUA will remain in effect (meaning this test can be used) for the duration of the COVID-19 declaration under Section 564(b)(1) of the Act, 21 U.S.C. section 360bbb-3(b)(1), unless the authorization is terminated or revoked.  Performed at [facility], [address], [place], [place] [address]      Labs: BNP (last 3 results) No results for input(s): BNP in the last 8760 hours. Basic Metabolic Panel: Recent Labs  Lab  03/27/21 0729 03/28/21 0517 03/29/21 0557  NA 124* 131* 131*  K 4.8 4.5 4.1  CL 87* 97* 102  CO2 27 25 27   GLUCOSE 249* 104* 99  BUN 66* 31* 15  CREATININE 1.72* 0.94 0.66  CALCIUM 8.4* 8.1* 7.6*  MG 2.9* 2.2  --   PHOS 4.2 2.5  --    Liver Function Tests: Recent Labs  Lab 03/27/21 0729 03/28/21 0517 03/29/21 0557  AST 321* 165* 134*  ALT 100* 70* 75*  ALKPHOS 63 51 53  BILITOT 1.1 0.9 0.8  PROT 7.8 6.1* 5.9*  ALBUMIN 3.9 3.1* 2.8*   No results for input(s): LIPASE, AMYLASE in the last 168 hours.  No results for input(s): AMMONIA in the last 168 hours. CBC: Recent Labs  Lab 03/27/21 0729 03/28/21 0517 03/29/21 0557  WBC 14.4* 9.7 7.1  NEUTROABS 12.2* 7.6 4.9  HGB 12.0* 10.7* 10.1*  HCT 34.9* 32.3* 30.4*  MCV 92.8 95.6 95.9  PLT 321 300 302   Cardiac Enzymes: Recent Labs  Lab 03/27/21 0729 03/28/21 0517 03/29/21 0557  CKTOTAL 11,462* 3,371* 2,113*   BNP: Invalid input(s): POCBNP CBG: Recent Labs  Lab 03/28/21 1637 03/28/21 2202 03/29/21 0208 03/29/21 0726 03/29/21 1133  GLUCAP 101* 99 83 94 133*   D-Dimer No results for input(s): DDIMER in the last 72 hours. Hgb A1c Recent Labs    03/27/21 0729  HGBA1C 5.9*   Lipid Profile No results for input(s): CHOL, HDL, LDLCALC, TRIG, CHOLHDL, LDLDIRECT in the last 72 hours. Thyroid function studies No results for input(s): TSH, T4TOTAL, T3FREE, THYROIDAB in the last 72 hours.  Invalid input(s): FREET3 Anemia work up Recent Labs    03/27/21 0729  VITAMINB12 428  FOLATE 9.5  FERRITIN 284  TIBC 286  IRON 48  RETICCTPCT 1.3   Urinalysis    Component Value Date/Time   COLORURINE YELLOW 03/27/2021 0730   APPEARANCEUR HAZY (A) 03/27/2021 0730   LABSPEC 1.015 03/27/2021 0730   PHURINE 5.0 03/27/2021 0730   GLUCOSEU NEGATIVE 03/27/2021 0730   HGBUR LARGE (A) 03/27/2021 0730   BILIRUBINUR NEGATIVE 03/27/2021 0730   KETONESUR NEGATIVE 03/27/2021 0730   PROTEINUR 30 (A) 03/27/2021 0730    NITRITE NEGATIVE 03/27/2021 0730   LEUKOCYTESUR NEGATIVE 03/27/2021 0730   Sepsis Labs Invalid input(s): PROCALCITONIN,  WBC,  LACTICIDVEN Microbiology Recent Results (from the past 240 hour(s))  Resp Panel by RT-PCR (Flu A&B, Covid) Nasopharyngeal Swab     Status: Abnormal   Collection Time: 03/27/21 12:40 PM   Specimen: Nasopharyngeal Swab; Nasopharyngeal(NP) swabs in vial transport medium  Result Value Ref Range Status   SARS Coronavirus 2 by RT PCR POSITIVE (A) NEGATIVE Final    Comment: RESULT CALLED TO, READ BACK BY AND VERIFIED WITH: HARRIS,M ON 03/27/21 AT 1540 BY LOY,C (NOTE) SARS-CoV-2 target nucleic acids are DETECTED.  The SARS-CoV-2 RNA is generally detectable in upper respiratory specimens during the acute phase of infection. Positive results are indicative of the presence of the identified virus, but do not rule out bacterial infection or co-infection with other pathogens not detected by the test. Clinical correlation with patient history and other diagnostic information is necessary to determine patient infection status. The expected result is Negative.  Fact Sheet for Patients: EntrepreneurPulse.com.au  Fact Sheet for Healthcare Providers: IncredibleEmployment.be  This test is not yet approved or cleared by the [place] FDA and  has been authorized for detection and/or diagnosis of SARS-CoV-2 by FDA under an Emergency Use Authorization (EUA).  This EUA will remain in effect (meaning this test can  be used) for the duration of  the COVID-19 declaration under Section 564(b)(1) of the Act, 21 U.S.C. section 360bbb-3(b)(1), unless the authorization is terminated or revoked sooner.     Influenza A by PCR NEGATIVE NEGATIVE Final   Influenza B by PCR NEGATIVE NEGATIVE Final    Comment: (NOTE) The Xpert Xpress SARS-CoV-2/FLU/RSV plus assay is intended as an aid in the diagnosis of influenza from Nasopharyngeal swab specimens  and should not be used as a sole basis for treatment. Nasal washings and aspirates are unacceptable for Xpert Xpress SARS-CoV-2/FLU/RSV testing.  Fact Sheet for Patients: EntrepreneurPulse.com.au  Fact Sheet for Healthcare Providers: IncredibleEmployment.be  This test is not yet approved or cleared by the [place] and has been authorized for detection and/or diagnosis of SARS-CoV-2 by FDA under an Emergency Use Authorization (EUA). This EUA will remain in effect (meaning this test can be used) for the duration of the COVID-19 declaration under Section 564(b)(1) of the Act, 21 U.S.C. section 360bbb-3(b)(1), unless the authorization is terminated or revoked.  Performed at [facility], [address], [place], [place] [address]    Time coordinating discharge: 35 minutes  SIGNED:   Irwin Brakeman, MD [place] [facility] 03/29/2021, 12:30 PM  If 7PM-7AM, please contact night-coverage [email or web address] __________________________________________________________________ ATTENDING NOTE  Patient seen and examined with Fredrik Rigger, Medical student. In addition to supervising the encounter, I played a key role in the decision making process as well as reviewed key findings.  Pt has REFUSED SNF placement but did accept going home with [facility] services which have been arranged.  He is medically stable to discharge today.  His exam is stable and labs are markedly improved.    Murvin Natal, MD  Attending Physician [facility] How to contact the [facility] Attending or Consulting provider [place] or covering provider during after hours [place], for this patient?  Check the care team in [facility] and look for a) attending/consulting TR[facility] provider listed and b) the [facility] team listed Log into [email or web address] and use [place]'s universal password to access. If you do not have the password, please contact the hospital operator. Locate the [facility]  provider you are looking for under [facility] and page to a number that you can be directly reached. If you still have difficulty reaching the provider, please page the [facility] (Director on Call) for the Hospitalists listed on amion for assistance.

## 2021-03-29 NOTE — Discharge Instructions (Signed)

## 2021-03-29 NOTE — Progress Notes (Signed)
Patient discharged home with discharge instructions . Removed Ivs' personal belongings with patient

## 2021-03-29 NOTE — TOC Transition Note (Signed)
Transition of Care [facility]) - CM/SW Discharge Note   Patient Details  Name: Henry [name] MRN: [id number] Date of Birth: 17-Mar-1946  Transition of Care [facility]) CM/SW Contact:  Natasha Bence, LCSW Phone Number: 03/29/2021, 12:20 PM   Clinical Narrative:    CSW notified of patient's readiness for discharge. CSW also notified of patient's dme need. CSW placed referral for rolling walker with [employer]. Shelia with [employer] agreeable to provide RW. CSW notified Georgina Snell with [facility] of patient's discharge. Georgina Snell agreeable to provide services upon discharge. TOC signing off.   Final next level of care: [place] Barriers to Discharge: Barriers Resolved   Patient Goals and CMS Choice Patient states their goals for this hospitalization and ongoing recovery are:: Return home with [facility] CMS Medicare.gov Compare Post Acute Care list provided to:: Patient Choice offered to / list presented to : Patient  Discharge Placement                    Patient and family notified of of transfer: 03/29/21  Discharge Plan and Services In-house Referral: Clinical Social Work   Post Acute Care Choice: Home Health          DME Arranged: Gilford Rile rolling DME Agency: [employer]       HH Arranged: RN, PT [place] Agency: [place] Date [place]: 03/29/21 Time [place]: 1220 Representative spoke with at [place]: [place] ([place]) Interventions     Readmission Risk Interventions No flowsheet data found.

## 2021-03-30 DIAGNOSIS — L89151 Pressure ulcer of sacral region, stage 1: Secondary | ICD-10-CM | POA: Diagnosis not present

## 2021-03-30 DIAGNOSIS — I119 Hypertensive heart disease without heart failure: Secondary | ICD-10-CM | POA: Diagnosis not present

## 2021-03-30 DIAGNOSIS — T796XXD Traumatic ischemia of muscle, subsequent encounter: Secondary | ICD-10-CM | POA: Diagnosis not present

## 2021-03-30 DIAGNOSIS — N281 Cyst of kidney, acquired: Secondary | ICD-10-CM | POA: Diagnosis not present

## 2021-03-30 DIAGNOSIS — E119 Type 2 diabetes mellitus without complications: Secondary | ICD-10-CM | POA: Diagnosis not present

## 2021-03-30 DIAGNOSIS — U071 COVID-19: Secondary | ICD-10-CM | POA: Diagnosis not present

## 2021-03-30 DIAGNOSIS — G319 Degenerative disease of nervous system, unspecified: Secondary | ICD-10-CM | POA: Diagnosis not present

## 2021-03-30 DIAGNOSIS — D649 Anemia, unspecified: Secondary | ICD-10-CM | POA: Diagnosis not present

## 2021-03-30 DIAGNOSIS — M47814 Spondylosis without myelopathy or radiculopathy, thoracic region: Secondary | ICD-10-CM | POA: Diagnosis not present

## 2021-04-01 DIAGNOSIS — N281 Cyst of kidney, acquired: Secondary | ICD-10-CM | POA: Diagnosis not present

## 2021-04-01 DIAGNOSIS — L89151 Pressure ulcer of sacral region, stage 1: Secondary | ICD-10-CM | POA: Diagnosis not present

## 2021-04-01 DIAGNOSIS — M47814 Spondylosis without myelopathy or radiculopathy, thoracic region: Secondary | ICD-10-CM | POA: Diagnosis not present

## 2021-04-01 DIAGNOSIS — U071 COVID-19: Secondary | ICD-10-CM | POA: Diagnosis not present

## 2021-04-01 DIAGNOSIS — D649 Anemia, unspecified: Secondary | ICD-10-CM | POA: Diagnosis not present

## 2021-04-01 DIAGNOSIS — T796XXD Traumatic ischemia of muscle, subsequent encounter: Secondary | ICD-10-CM | POA: Diagnosis not present

## 2021-04-01 DIAGNOSIS — E119 Type 2 diabetes mellitus without complications: Secondary | ICD-10-CM | POA: Diagnosis not present

## 2021-04-01 DIAGNOSIS — I119 Hypertensive heart disease without heart failure: Secondary | ICD-10-CM | POA: Diagnosis not present

## 2021-04-01 DIAGNOSIS — G319 Degenerative disease of nervous system, unspecified: Secondary | ICD-10-CM | POA: Diagnosis not present

## 2021-04-02 DIAGNOSIS — D649 Anemia, unspecified: Secondary | ICD-10-CM | POA: Diagnosis not present

## 2021-04-02 DIAGNOSIS — I119 Hypertensive heart disease without heart failure: Secondary | ICD-10-CM | POA: Diagnosis not present

## 2021-04-02 DIAGNOSIS — E119 Type 2 diabetes mellitus without complications: Secondary | ICD-10-CM | POA: Diagnosis not present

## 2021-04-02 DIAGNOSIS — N281 Cyst of kidney, acquired: Secondary | ICD-10-CM | POA: Diagnosis not present

## 2021-04-02 DIAGNOSIS — U071 COVID-19: Secondary | ICD-10-CM | POA: Diagnosis not present

## 2021-04-02 DIAGNOSIS — T796XXD Traumatic ischemia of muscle, subsequent encounter: Secondary | ICD-10-CM | POA: Diagnosis not present

## 2021-04-02 DIAGNOSIS — L89151 Pressure ulcer of sacral region, stage 1: Secondary | ICD-10-CM | POA: Diagnosis not present

## 2021-04-02 DIAGNOSIS — G319 Degenerative disease of nervous system, unspecified: Secondary | ICD-10-CM | POA: Diagnosis not present

## 2021-04-02 DIAGNOSIS — M47814 Spondylosis without myelopathy or radiculopathy, thoracic region: Secondary | ICD-10-CM | POA: Diagnosis not present

## 2021-04-03 DIAGNOSIS — E119 Type 2 diabetes mellitus without complications: Secondary | ICD-10-CM | POA: Diagnosis not present

## 2021-04-03 DIAGNOSIS — U071 COVID-19: Secondary | ICD-10-CM | POA: Diagnosis not present

## 2021-04-03 DIAGNOSIS — L89151 Pressure ulcer of sacral region, stage 1: Secondary | ICD-10-CM | POA: Diagnosis not present

## 2021-04-03 DIAGNOSIS — G319 Degenerative disease of nervous system, unspecified: Secondary | ICD-10-CM | POA: Diagnosis not present

## 2021-04-03 DIAGNOSIS — T796XXD Traumatic ischemia of muscle, subsequent encounter: Secondary | ICD-10-CM | POA: Diagnosis not present

## 2021-04-03 DIAGNOSIS — N281 Cyst of kidney, acquired: Secondary | ICD-10-CM | POA: Diagnosis not present

## 2021-04-03 DIAGNOSIS — I119 Hypertensive heart disease without heart failure: Secondary | ICD-10-CM | POA: Diagnosis not present

## 2021-04-03 DIAGNOSIS — M47814 Spondylosis without myelopathy or radiculopathy, thoracic region: Secondary | ICD-10-CM | POA: Diagnosis not present

## 2021-04-03 DIAGNOSIS — D649 Anemia, unspecified: Secondary | ICD-10-CM | POA: Diagnosis not present

## 2021-04-04 DIAGNOSIS — E119 Type 2 diabetes mellitus without complications: Secondary | ICD-10-CM | POA: Diagnosis not present

## 2021-04-04 DIAGNOSIS — N281 Cyst of kidney, acquired: Secondary | ICD-10-CM | POA: Diagnosis not present

## 2021-04-04 DIAGNOSIS — D649 Anemia, unspecified: Secondary | ICD-10-CM | POA: Diagnosis not present

## 2021-04-04 DIAGNOSIS — I119 Hypertensive heart disease without heart failure: Secondary | ICD-10-CM | POA: Diagnosis not present

## 2021-04-04 DIAGNOSIS — G319 Degenerative disease of nervous system, unspecified: Secondary | ICD-10-CM | POA: Diagnosis not present

## 2021-04-04 DIAGNOSIS — M47814 Spondylosis without myelopathy or radiculopathy, thoracic region: Secondary | ICD-10-CM | POA: Diagnosis not present

## 2021-04-04 DIAGNOSIS — U071 COVID-19: Secondary | ICD-10-CM | POA: Diagnosis not present

## 2021-04-04 DIAGNOSIS — L89151 Pressure ulcer of sacral region, stage 1: Secondary | ICD-10-CM | POA: Diagnosis not present

## 2021-04-04 DIAGNOSIS — T796XXD Traumatic ischemia of muscle, subsequent encounter: Secondary | ICD-10-CM | POA: Diagnosis not present

## 2021-04-05 DIAGNOSIS — G319 Degenerative disease of nervous system, unspecified: Secondary | ICD-10-CM | POA: Diagnosis not present

## 2021-04-05 DIAGNOSIS — D649 Anemia, unspecified: Secondary | ICD-10-CM | POA: Diagnosis not present

## 2021-04-05 DIAGNOSIS — I119 Hypertensive heart disease without heart failure: Secondary | ICD-10-CM | POA: Diagnosis not present

## 2021-04-05 DIAGNOSIS — N281 Cyst of kidney, acquired: Secondary | ICD-10-CM | POA: Diagnosis not present

## 2021-04-05 DIAGNOSIS — E119 Type 2 diabetes mellitus without complications: Secondary | ICD-10-CM | POA: Diagnosis not present

## 2021-04-05 DIAGNOSIS — T796XXD Traumatic ischemia of muscle, subsequent encounter: Secondary | ICD-10-CM | POA: Diagnosis not present

## 2021-04-05 DIAGNOSIS — M47814 Spondylosis without myelopathy or radiculopathy, thoracic region: Secondary | ICD-10-CM | POA: Diagnosis not present

## 2021-04-05 DIAGNOSIS — U071 COVID-19: Secondary | ICD-10-CM | POA: Diagnosis not present

## 2021-04-05 DIAGNOSIS — L89151 Pressure ulcer of sacral region, stage 1: Secondary | ICD-10-CM | POA: Diagnosis not present

## 2021-04-08 DIAGNOSIS — E119 Type 2 diabetes mellitus without complications: Secondary | ICD-10-CM | POA: Diagnosis not present

## 2021-04-08 DIAGNOSIS — L89151 Pressure ulcer of sacral region, stage 1: Secondary | ICD-10-CM | POA: Diagnosis not present

## 2021-04-08 DIAGNOSIS — N281 Cyst of kidney, acquired: Secondary | ICD-10-CM | POA: Diagnosis not present

## 2021-04-08 DIAGNOSIS — D649 Anemia, unspecified: Secondary | ICD-10-CM | POA: Diagnosis not present

## 2021-04-08 DIAGNOSIS — M47814 Spondylosis without myelopathy or radiculopathy, thoracic region: Secondary | ICD-10-CM | POA: Diagnosis not present

## 2021-04-08 DIAGNOSIS — I119 Hypertensive heart disease without heart failure: Secondary | ICD-10-CM | POA: Diagnosis not present

## 2021-04-08 DIAGNOSIS — T796XXD Traumatic ischemia of muscle, subsequent encounter: Secondary | ICD-10-CM | POA: Diagnosis not present

## 2021-04-08 DIAGNOSIS — U071 COVID-19: Secondary | ICD-10-CM | POA: Diagnosis not present

## 2021-04-08 DIAGNOSIS — G319 Degenerative disease of nervous system, unspecified: Secondary | ICD-10-CM | POA: Diagnosis not present

## 2021-04-09 DIAGNOSIS — I119 Hypertensive heart disease without heart failure: Secondary | ICD-10-CM | POA: Diagnosis not present

## 2021-04-09 DIAGNOSIS — L89151 Pressure ulcer of sacral region, stage 1: Secondary | ICD-10-CM | POA: Diagnosis not present

## 2021-04-09 DIAGNOSIS — D649 Anemia, unspecified: Secondary | ICD-10-CM | POA: Diagnosis not present

## 2021-04-09 DIAGNOSIS — N281 Cyst of kidney, acquired: Secondary | ICD-10-CM | POA: Diagnosis not present

## 2021-04-09 DIAGNOSIS — M47814 Spondylosis without myelopathy or radiculopathy, thoracic region: Secondary | ICD-10-CM | POA: Diagnosis not present

## 2021-04-09 DIAGNOSIS — E119 Type 2 diabetes mellitus without complications: Secondary | ICD-10-CM | POA: Diagnosis not present

## 2021-04-09 DIAGNOSIS — U071 COVID-19: Secondary | ICD-10-CM | POA: Diagnosis not present

## 2021-04-09 DIAGNOSIS — T796XXD Traumatic ischemia of muscle, subsequent encounter: Secondary | ICD-10-CM | POA: Diagnosis not present

## 2021-04-09 DIAGNOSIS — G319 Degenerative disease of nervous system, unspecified: Secondary | ICD-10-CM | POA: Diagnosis not present

## 2021-04-10 DIAGNOSIS — U071 COVID-19: Secondary | ICD-10-CM | POA: Diagnosis not present

## 2021-04-10 DIAGNOSIS — D649 Anemia, unspecified: Secondary | ICD-10-CM | POA: Diagnosis not present

## 2021-04-10 DIAGNOSIS — T796XXD Traumatic ischemia of muscle, subsequent encounter: Secondary | ICD-10-CM | POA: Diagnosis not present

## 2021-04-10 DIAGNOSIS — N281 Cyst of kidney, acquired: Secondary | ICD-10-CM | POA: Diagnosis not present

## 2021-04-10 DIAGNOSIS — M47814 Spondylosis without myelopathy or radiculopathy, thoracic region: Secondary | ICD-10-CM | POA: Diagnosis not present

## 2021-04-10 DIAGNOSIS — L89151 Pressure ulcer of sacral region, stage 1: Secondary | ICD-10-CM | POA: Diagnosis not present

## 2021-04-10 DIAGNOSIS — I119 Hypertensive heart disease without heart failure: Secondary | ICD-10-CM | POA: Diagnosis not present

## 2021-04-10 DIAGNOSIS — E119 Type 2 diabetes mellitus without complications: Secondary | ICD-10-CM | POA: Diagnosis not present

## 2021-04-10 DIAGNOSIS — G319 Degenerative disease of nervous system, unspecified: Secondary | ICD-10-CM | POA: Diagnosis not present

## 2021-04-11 DIAGNOSIS — G319 Degenerative disease of nervous system, unspecified: Secondary | ICD-10-CM | POA: Diagnosis not present

## 2021-04-11 DIAGNOSIS — M47814 Spondylosis without myelopathy or radiculopathy, thoracic region: Secondary | ICD-10-CM | POA: Diagnosis not present

## 2021-04-11 DIAGNOSIS — E119 Type 2 diabetes mellitus without complications: Secondary | ICD-10-CM | POA: Diagnosis not present

## 2021-04-11 DIAGNOSIS — T796XXD Traumatic ischemia of muscle, subsequent encounter: Secondary | ICD-10-CM | POA: Diagnosis not present

## 2021-04-11 DIAGNOSIS — L89151 Pressure ulcer of sacral region, stage 1: Secondary | ICD-10-CM | POA: Diagnosis not present

## 2021-04-11 DIAGNOSIS — N281 Cyst of kidney, acquired: Secondary | ICD-10-CM | POA: Diagnosis not present

## 2021-04-11 DIAGNOSIS — I119 Hypertensive heart disease without heart failure: Secondary | ICD-10-CM | POA: Diagnosis not present

## 2021-04-11 DIAGNOSIS — U071 COVID-19: Secondary | ICD-10-CM | POA: Diagnosis not present

## 2021-04-11 DIAGNOSIS — D649 Anemia, unspecified: Secondary | ICD-10-CM | POA: Diagnosis not present

## 2021-04-12 DIAGNOSIS — T796XXD Traumatic ischemia of muscle, subsequent encounter: Secondary | ICD-10-CM | POA: Diagnosis not present

## 2021-04-12 DIAGNOSIS — D649 Anemia, unspecified: Secondary | ICD-10-CM | POA: Diagnosis not present

## 2021-04-12 DIAGNOSIS — E119 Type 2 diabetes mellitus without complications: Secondary | ICD-10-CM | POA: Diagnosis not present

## 2021-04-12 DIAGNOSIS — G319 Degenerative disease of nervous system, unspecified: Secondary | ICD-10-CM | POA: Diagnosis not present

## 2021-04-12 DIAGNOSIS — U071 COVID-19: Secondary | ICD-10-CM | POA: Diagnosis not present

## 2021-04-12 DIAGNOSIS — I119 Hypertensive heart disease without heart failure: Secondary | ICD-10-CM | POA: Diagnosis not present

## 2021-04-12 DIAGNOSIS — L89151 Pressure ulcer of sacral region, stage 1: Secondary | ICD-10-CM | POA: Diagnosis not present

## 2021-04-12 DIAGNOSIS — N281 Cyst of kidney, acquired: Secondary | ICD-10-CM | POA: Diagnosis not present

## 2021-04-12 DIAGNOSIS — M47814 Spondylosis without myelopathy or radiculopathy, thoracic region: Secondary | ICD-10-CM | POA: Diagnosis not present

## 2021-04-15 DIAGNOSIS — L89151 Pressure ulcer of sacral region, stage 1: Secondary | ICD-10-CM | POA: Diagnosis not present

## 2021-04-15 DIAGNOSIS — R6889 Other general symptoms and signs: Secondary | ICD-10-CM | POA: Diagnosis not present

## 2021-04-15 DIAGNOSIS — L89159 Pressure ulcer of sacral region, unspecified stage: Secondary | ICD-10-CM | POA: Diagnosis not present

## 2021-04-15 DIAGNOSIS — G319 Degenerative disease of nervous system, unspecified: Secondary | ICD-10-CM | POA: Diagnosis not present

## 2021-04-15 DIAGNOSIS — M6282 Rhabdomyolysis: Secondary | ICD-10-CM | POA: Diagnosis not present

## 2021-04-15 DIAGNOSIS — D649 Anemia, unspecified: Secondary | ICD-10-CM | POA: Diagnosis not present

## 2021-04-15 DIAGNOSIS — N179 Acute kidney failure, unspecified: Secondary | ICD-10-CM | POA: Diagnosis not present

## 2021-04-15 DIAGNOSIS — E119 Type 2 diabetes mellitus without complications: Secondary | ICD-10-CM | POA: Diagnosis not present

## 2021-04-15 DIAGNOSIS — N281 Cyst of kidney, acquired: Secondary | ICD-10-CM | POA: Diagnosis not present

## 2021-04-15 DIAGNOSIS — M47814 Spondylosis without myelopathy or radiculopathy, thoracic region: Secondary | ICD-10-CM | POA: Diagnosis not present

## 2021-04-15 DIAGNOSIS — E1165 Type 2 diabetes mellitus with hyperglycemia: Secondary | ICD-10-CM | POA: Diagnosis not present

## 2021-04-15 DIAGNOSIS — I119 Hypertensive heart disease without heart failure: Secondary | ICD-10-CM | POA: Diagnosis not present

## 2021-04-15 DIAGNOSIS — U071 COVID-19: Secondary | ICD-10-CM | POA: Diagnosis not present

## 2021-04-15 DIAGNOSIS — T796XXD Traumatic ischemia of muscle, subsequent encounter: Secondary | ICD-10-CM | POA: Diagnosis not present

## 2021-04-15 DIAGNOSIS — W19XXXA Unspecified fall, initial encounter: Secondary | ICD-10-CM | POA: Diagnosis not present

## 2021-04-17 DIAGNOSIS — D649 Anemia, unspecified: Secondary | ICD-10-CM | POA: Diagnosis not present

## 2021-04-17 DIAGNOSIS — G319 Degenerative disease of nervous system, unspecified: Secondary | ICD-10-CM | POA: Diagnosis not present

## 2021-04-17 DIAGNOSIS — M47814 Spondylosis without myelopathy or radiculopathy, thoracic region: Secondary | ICD-10-CM | POA: Diagnosis not present

## 2021-04-17 DIAGNOSIS — U071 COVID-19: Secondary | ICD-10-CM | POA: Diagnosis not present

## 2021-04-17 DIAGNOSIS — E119 Type 2 diabetes mellitus without complications: Secondary | ICD-10-CM | POA: Diagnosis not present

## 2021-04-17 DIAGNOSIS — I119 Hypertensive heart disease without heart failure: Secondary | ICD-10-CM | POA: Diagnosis not present

## 2021-04-17 DIAGNOSIS — N281 Cyst of kidney, acquired: Secondary | ICD-10-CM | POA: Diagnosis not present

## 2021-04-17 DIAGNOSIS — L89151 Pressure ulcer of sacral region, stage 1: Secondary | ICD-10-CM | POA: Diagnosis not present

## 2021-04-17 DIAGNOSIS — T796XXD Traumatic ischemia of muscle, subsequent encounter: Secondary | ICD-10-CM | POA: Diagnosis not present

## 2021-04-18 DIAGNOSIS — G319 Degenerative disease of nervous system, unspecified: Secondary | ICD-10-CM | POA: Diagnosis not present

## 2021-04-18 DIAGNOSIS — L89151 Pressure ulcer of sacral region, stage 1: Secondary | ICD-10-CM | POA: Diagnosis not present

## 2021-04-18 DIAGNOSIS — E119 Type 2 diabetes mellitus without complications: Secondary | ICD-10-CM | POA: Diagnosis not present

## 2021-04-18 DIAGNOSIS — U071 COVID-19: Secondary | ICD-10-CM | POA: Diagnosis not present

## 2021-04-18 DIAGNOSIS — M47814 Spondylosis without myelopathy or radiculopathy, thoracic region: Secondary | ICD-10-CM | POA: Diagnosis not present

## 2021-04-18 DIAGNOSIS — T796XXD Traumatic ischemia of muscle, subsequent encounter: Secondary | ICD-10-CM | POA: Diagnosis not present

## 2021-04-18 DIAGNOSIS — D649 Anemia, unspecified: Secondary | ICD-10-CM | POA: Diagnosis not present

## 2021-04-18 DIAGNOSIS — N281 Cyst of kidney, acquired: Secondary | ICD-10-CM | POA: Diagnosis not present

## 2021-04-18 DIAGNOSIS — I119 Hypertensive heart disease without heart failure: Secondary | ICD-10-CM | POA: Diagnosis not present

## 2021-04-19 DIAGNOSIS — N281 Cyst of kidney, acquired: Secondary | ICD-10-CM | POA: Diagnosis not present

## 2021-04-19 DIAGNOSIS — G319 Degenerative disease of nervous system, unspecified: Secondary | ICD-10-CM | POA: Diagnosis not present

## 2021-04-19 DIAGNOSIS — M47814 Spondylosis without myelopathy or radiculopathy, thoracic region: Secondary | ICD-10-CM | POA: Diagnosis not present

## 2021-04-19 DIAGNOSIS — U071 COVID-19: Secondary | ICD-10-CM | POA: Diagnosis not present

## 2021-04-19 DIAGNOSIS — E119 Type 2 diabetes mellitus without complications: Secondary | ICD-10-CM | POA: Diagnosis not present

## 2021-04-19 DIAGNOSIS — L89151 Pressure ulcer of sacral region, stage 1: Secondary | ICD-10-CM | POA: Diagnosis not present

## 2021-04-19 DIAGNOSIS — T796XXD Traumatic ischemia of muscle, subsequent encounter: Secondary | ICD-10-CM | POA: Diagnosis not present

## 2021-04-19 DIAGNOSIS — I119 Hypertensive heart disease without heart failure: Secondary | ICD-10-CM | POA: Diagnosis not present

## 2021-04-19 DIAGNOSIS — D649 Anemia, unspecified: Secondary | ICD-10-CM | POA: Diagnosis not present

## 2021-04-22 DIAGNOSIS — T796XXD Traumatic ischemia of muscle, subsequent encounter: Secondary | ICD-10-CM | POA: Diagnosis not present

## 2021-04-22 DIAGNOSIS — M47814 Spondylosis without myelopathy or radiculopathy, thoracic region: Secondary | ICD-10-CM | POA: Diagnosis not present

## 2021-04-22 DIAGNOSIS — E119 Type 2 diabetes mellitus without complications: Secondary | ICD-10-CM | POA: Diagnosis not present

## 2021-04-22 DIAGNOSIS — D649 Anemia, unspecified: Secondary | ICD-10-CM | POA: Diagnosis not present

## 2021-04-22 DIAGNOSIS — I119 Hypertensive heart disease without heart failure: Secondary | ICD-10-CM | POA: Diagnosis not present

## 2021-04-22 DIAGNOSIS — N281 Cyst of kidney, acquired: Secondary | ICD-10-CM | POA: Diagnosis not present

## 2021-04-22 DIAGNOSIS — G319 Degenerative disease of nervous system, unspecified: Secondary | ICD-10-CM | POA: Diagnosis not present

## 2021-04-22 DIAGNOSIS — U071 COVID-19: Secondary | ICD-10-CM | POA: Diagnosis not present

## 2021-04-22 DIAGNOSIS — L89151 Pressure ulcer of sacral region, stage 1: Secondary | ICD-10-CM | POA: Diagnosis not present

## 2021-04-24 ENCOUNTER — Other Ambulatory Visit: Payer: Self-pay

## 2021-04-24 ENCOUNTER — Ambulatory Visit (HOSPITAL_COMMUNITY): Payer: Medicare PPO | Attending: Family Medicine | Admitting: Physical Therapy

## 2021-04-24 DIAGNOSIS — U071 COVID-19: Secondary | ICD-10-CM | POA: Diagnosis not present

## 2021-04-24 DIAGNOSIS — S31000S Unspecified open wound of lower back and pelvis without penetration into retroperitoneum, sequela: Secondary | ICD-10-CM | POA: Diagnosis not present

## 2021-04-24 DIAGNOSIS — M533 Sacrococcygeal disorders, not elsewhere classified: Secondary | ICD-10-CM | POA: Diagnosis not present

## 2021-04-24 DIAGNOSIS — G319 Degenerative disease of nervous system, unspecified: Secondary | ICD-10-CM | POA: Diagnosis not present

## 2021-04-24 DIAGNOSIS — I119 Hypertensive heart disease without heart failure: Secondary | ICD-10-CM | POA: Diagnosis not present

## 2021-04-24 DIAGNOSIS — T796XXD Traumatic ischemia of muscle, subsequent encounter: Secondary | ICD-10-CM | POA: Diagnosis not present

## 2021-04-24 DIAGNOSIS — L89151 Pressure ulcer of sacral region, stage 1: Secondary | ICD-10-CM | POA: Diagnosis not present

## 2021-04-24 DIAGNOSIS — M47814 Spondylosis without myelopathy or radiculopathy, thoracic region: Secondary | ICD-10-CM | POA: Diagnosis not present

## 2021-04-24 DIAGNOSIS — R600 Localized edema: Secondary | ICD-10-CM | POA: Insufficient documentation

## 2021-04-24 DIAGNOSIS — S31000D Unspecified open wound of lower back and pelvis without penetration into retroperitoneum, subsequent encounter: Secondary | ICD-10-CM | POA: Insufficient documentation

## 2021-04-24 DIAGNOSIS — E119 Type 2 diabetes mellitus without complications: Secondary | ICD-10-CM | POA: Diagnosis not present

## 2021-04-24 DIAGNOSIS — D649 Anemia, unspecified: Secondary | ICD-10-CM | POA: Diagnosis not present

## 2021-04-24 DIAGNOSIS — N281 Cyst of kidney, acquired: Secondary | ICD-10-CM | POA: Diagnosis not present

## 2021-04-24 NOTE — Therapy (Signed)
[place], [place], [address] Phone: [phone]   Fax:  [phone]  Physical Therapy Evaluation  Patient Details  Name: Henry [name] MRN: [id number] Date of Birth: 25-May-1946 Referring Provider (PT): Henry [name]   Encounter Date: 04/24/2021   PT End of Session - 04/24/21 1356     Visit Number 1    Number of Visits 12    Date for PT Re-Evaluation 06/05/21    Authorization Type [employer] put it request    PT Start Time [id number]    PT Stop Time 1550    PT Time Calculation (min) 35 min    Activity Tolerance Patient tolerated treatment well             Past Medical History:  Diagnosis Date   Diabetes mellitus without complication ([place])    Hypercholesteremia    Hypertension     Past Surgical History:  Procedure Laterality Date   APPENDECTOMY     COLONOSCOPY N/A 02/25/2018   Procedure: COLONOSCOPY;  Surgeon: Danie Binder, MD;  [facility];  Service: Endoscopy;  Laterality: N/A;  8:30   Left leg surgery     NASAL SEPTUM SURGERY     NASAL SINUS SURGERY     POLYPECTOMY  02/25/2018   Procedure: POLYPECTOMY;  Surgeon: Danie Binder, MD;  [facility];  Service: Endoscopy;;  ascending x2, descending x2, hepatic flexure polyps x2   TONSILLECTOMY      There were no vitals filed for this visit.        [facility] PT Assessment - 04/24/21 0001       Assessment   Medical Diagnosis sacral decubitis    Referring Provider (PT) Henry [name]    Onset Date/Surgical Date 02/03/21    Prior Therapy none      Precautions   Precautions --   infection from open wound     Restrictions   Weight Bearing Restrictions No      Balance Screen   Has the patient fallen in the past 6 months Yes    How many times? 2    Has the patient had a decrease in activity level because of a fear of falling?  Yes    Is the patient reluctant to leave their home because of a fear of falling?  No                         Objective measurements completed on examination: See above findings.      Wound Therapy - 04/24/21 0001     Subjective Pt states that he has had a sacral decubitus for months.  He was admitted into [facility] on 03/27/2021 with dx of rhabdomylosis and Covid.  He was discharged on 7/8.2022 . The recommendation for discharge was SNF but the pt refused and was discharged to home.  He has now been referred to skilled OP therapy for the care of his sacral decubitus.    Patient and Family Stated Goals wound to heal    Date of Onset 02/03/21    Prior Treatments acute, HH, self    Pain Scale 0-10    Pain Score 4     Pain Type Chronic pain    Pain Location Buttocks    Pain Orientation Proximal    Patients Stated Pain Goal 0    Evaluation and Treatment Procedures Explained to Patient/Family Yes    Evaluation and Treatment Procedures  agreed to    Wound Properties Date First Assessed: 04/24/21 Time First Assessed: 1330 Wound Type: Other (Comment) Location: Sacrum Location Orientation: Lower Present on Admission: Yes   Wound Image Images linked: 1    Dressing Type None    Site / Wound Assessment Dry;Clean;Pale    % Wound base Red or Granulating 0%    % Wound base Yellow/Fibrinous Exudate 100%    Peri-wound Assessment Erythema (blanchable)   on Lt side of buttock; this area is now indurated as well   Wound Length (cm) 5 cm    Wound Width (cm) 0.3 cm    Wound Depth (cm) --   unknown   Wound Surface Area (cm^2) 1.5 cm^2    Drainage Amount None    Treatment Cleansed;Debridement (Selective)    Selective Debridement - Location sacral    Selective Debridement - Tools Used Scalpel    Selective Debridement - Tissue Removed eschar    Wound Therapy - Clinical Statement Mr. Henry [name] is a 75 yo male who is a poor historian.  He states that he has had his sacral decubitus for a few weeks, however, the pt was hospitalized on July 6th and it is noted that is was present at this admission,  therefore the wound has been there at least since mid May.  He has been having home health come out once a week and putting ointment on the wound.  At this time the wound has no granulation, is 100% adherent eschar and non healing.  Mr. Henry [name] was educated on staying off his back, the wound was cross hatched with a scapel with medihoney and foam sacral dressing applied.  PT will benefit from skilled PT for wound care to prevent infection and allow pt to sit and lie supine again.    Wound Therapy - Functional Problem List unable to sit or lie supine or wound healing will be delayed    Factors Delaying/Impairing Wound Healing Diabetes Mellitus    Hydrotherapy Plan Debridement;Dressing change    Wound Therapy - Frequency 2X / week   x 6 weeks   Wound Therapy - Current Recommendations PT    Wound Plan cleans, debride with proper dressing    Dressing  medihoney and foam sacral dressing                     PT Education - 04/24/21 1355     Education Details keep drinking plenty of water, keep wt off sacral area, no sitting at home or lying on his back    Person(s) Educated Patient    Methods Explanation    Comprehension Verbalized understanding              PT Short Term Goals - 04/24/21 1400       PT SHORT TERM GOAL #1   Title Wound to be 80% granulated    Time 3    Period Weeks    Status New    Target Date 05/15/21      PT SHORT TERM GOAL #2   Title Pain in sacral area to be no greater than a 2/10    Time 3    Period Weeks               PT Long Term Goals - 04/24/21 1401       PT LONG TERM GOAL #1   Title wound to be healed to allow pt to be able to sit and lie on his back  Time 6    Period Weeks    Status New    Target Date 06/05/21      PT LONG TERM GOAL #2   Title PT to have no pain in his sacral area to allow ease of self hygeine and dressing    Time 6    Period Weeks    Status New                    Plan - 04/24/21 1358      Clinical Impression Statement see above    Personal Factors and Comorbidities Comorbidity 1    Examination-Activity Limitations Bed Mobility;Hygiene/Grooming;Sit;Sleep    Stability/Clinical Decision Making Stable/Uncomplicated    Clinical Decision Making Moderate    Rehab Potential Good    PT Frequency 2x / week    PT Duration 6 weeks    PT Treatment/Interventions Other (comment);Patient/family education   debridement and dressing change   PT Next Visit Plan debridement and dressing change; education as needed    PT Home Exercise Plan self manual to LT buttock to decrease edema             Patient will benefit from skilled therapeutic intervention in order to improve the following deficits and impairments:  Pain, Increased edema, Decreased skin integrity  Visit Diagnosis: Wound of sacral region, subsequent encounter  Sacral pain  Localized edema     Problem List Patient Active Problem List   Diagnosis Date Noted   Pressure injury of skin 03/28/2021   Rhabdomyolysis 03/27/2021   Hypertension 03/27/2021   Hyponatremia 03/27/2021   Hyperlipidemia 03/27/2021   Type 2 diabetes mellitus ([place]) with noted hyperglycemia 03/27/2021   Elevated CK 03/27/2021   Elevated LFTs 03/27/2021   Acute kidney injury ([place]) 03/27/2021   Urinary retention 03/27/2021   Anemia 03/27/2021   Leukocytosis 03/27/2021   Do not resuscitate 03/27/2021   Constipation 08/02/2020   Rectal bleeding 08/02/2020   Personal history of colonic polyps    Rayetta Humphrey, PT CLT [phone]  04/24/2021, 2:04 PM  [place], [place], [address] Phone: [phone]   Fax:  [phone]  Name: Henry [name] MRN: [id number] Date of Birth: [birth year]

## 2021-04-26 ENCOUNTER — Other Ambulatory Visit: Payer: Self-pay

## 2021-04-26 ENCOUNTER — Ambulatory Visit (HOSPITAL_COMMUNITY): Payer: Medicare PPO

## 2021-04-26 ENCOUNTER — Encounter (HOSPITAL_COMMUNITY): Payer: Self-pay

## 2021-04-26 DIAGNOSIS — R600 Localized edema: Secondary | ICD-10-CM

## 2021-04-26 DIAGNOSIS — M533 Sacrococcygeal disorders, not elsewhere classified: Secondary | ICD-10-CM

## 2021-04-26 DIAGNOSIS — S31000D Unspecified open wound of lower back and pelvis without penetration into retroperitoneum, subsequent encounter: Secondary | ICD-10-CM

## 2021-04-26 DIAGNOSIS — S31000S Unspecified open wound of lower back and pelvis without penetration into retroperitoneum, sequela: Secondary | ICD-10-CM | POA: Diagnosis not present

## 2021-04-26 NOTE — Therapy (Signed)
[place], [place], [address] Phone: [phone]   Fax:  [phone]  Wound Care Therapy  Patient Details  Name: Henry [name] MRN: [id number] Date of Birth: 06-09-1946 Referring Provider (PT): Drenda Freeze   Encounter Date: 04/26/2021   PT End of Session - 04/26/21 1804     Visit Number 2    Number of Visits 12    Date for PT Re-Evaluation 06/05/21    Authorization Type [employer] put it request    PT Start Time 1535    PT Stop Time 1605    PT Time Calculation (min) 30 min    Activity Tolerance Patient tolerated treatment well    Behavior During Therapy [facility] for tasks assessed/performed             Past Medical History:  Diagnosis Date   Diabetes mellitus without complication ([place])    Hypercholesteremia    Hypertension     Past Surgical History:  Procedure Laterality Date   APPENDECTOMY     COLONOSCOPY N/A 02/25/2018   Procedure: COLONOSCOPY;  Surgeon: Danie Binder, MD;  [facility];  Service: Endoscopy;  Laterality: N/A;  8:30   Left leg surgery     NASAL SEPTUM SURGERY     NASAL SINUS SURGERY     POLYPECTOMY  02/25/2018   Procedure: POLYPECTOMY;  Surgeon: Danie Binder, MD;  [facility];  Service: Endoscopy;;  ascending x2, descending x2, hepatic flexure polyps x2   TONSILLECTOMY      There were no vitals filed for this visit.               Wound Therapy - 04/26/21 0001     Subjective Reports he has tried not to sit or lay on back but is hard.    Patient and Family Stated Goals wound to heal    Date of Onset 02/03/21    Prior Treatments acute, HH, self    Pain Scale 0-10    Pain Score 0-No pain    Evaluation and Treatment Procedures Explained to Patient/Family Yes    Evaluation and Treatment Procedures agreed to    Wound Properties Date First Assessed: 04/24/21 Time First Assessed: 1330 Wound Type: Other (Comment) Location: Sacrum Location Orientation:  Lower Present on Admission: Yes   Wound Image Images linked: 1    Dressing Type --   Medihoney and Sacral foam bandage   Site / Wound Assessment Dry;Clean;Pale    % Wound base Red or Granulating 0%    % Wound base Yellow/Fibrinous Exudate 100%    Peri-wound Assessment Erythema (blanchable)    Treatment Cleansed;Debridement (Selective)    Selective Debridement - Location sacral    Selective Debridement - Tools Used Scalpel    Selective Debridement - Tissue Removed eschar    Wound Therapy - Clinical Statement Pt arrived with dressing intact.  Selective debridment for removal of adherent slough in wound bed.  Continued with medihoney to assist with loosing slough for wound healing.  Reviewed importance of staying off buttocks.  Need to review proper sitting posture next session to promote healing and reduce pressure on sacral area.    Wound Therapy - Functional Problem List unable to sit or lie supine or wound healing will be delayed    Factors Delaying/Impairing Wound Healing Diabetes Mellitus    Hydrotherapy Plan Debridement;Dressing change    Wound Therapy - Frequency 2X / week   6 weeks   Wound  Therapy - Current Recommendations PT    Wound Plan cleans, debride with proper dressing    Dressing  medihoney and foam sacral dressing                       PT Short Term Goals - 04/24/21 1400       PT SHORT TERM GOAL #1   Title Wound to be 80% granulated    Time 3    Period Weeks    Status New    Target Date 05/15/21      PT SHORT TERM GOAL #2   Title Pain in sacral area to be no greater than a 2/10    Time 3    Period Weeks               PT Long Term Goals - 04/24/21 1401       PT LONG TERM GOAL #1   Title wound to be healed to allow pt to be able to sit and lie on his back    Time 6    Period Weeks    Status New    Target Date 06/05/21      PT LONG TERM GOAL #2   Title PT to have no pain in his sacral area to allow ease of self hygeine and dressing     Time 6    Period Weeks    Status New                    Patient will benefit from skilled therapeutic intervention in order to improve the following deficits and impairments:     Visit Diagnosis: Localized edema  Sacral pain  Wound of sacral region, subsequent encounter     Problem List Patient Active Problem List   Diagnosis Date Noted   Pressure injury of skin 03/28/2021   Rhabdomyolysis 03/27/2021   Hypertension 03/27/2021   Hyponatremia 03/27/2021   Hyperlipidemia 03/27/2021   Type 2 diabetes mellitus ([place]) with noted hyperglycemia 03/27/2021   Elevated CK 03/27/2021   Elevated LFTs 03/27/2021   Acute kidney injury ([place]) 03/27/2021   Urinary retention 03/27/2021   Anemia 03/27/2021   Leukocytosis 03/27/2021   Do not resuscitate 03/27/2021   Constipation 08/02/2020   Rectal bleeding 08/02/2020   Personal history of colonic polyps    Ihor Austin, LPTA/CLT; CBIS [phone]  Aldona Lento 04/26/2021, 6:05 PM  [place], [place], [address] Phone: [phone]   Fax:  [phone]  Name: Henry [name] MRN: [id number] Date of Birth: 1945/12/27

## 2021-04-29 ENCOUNTER — Other Ambulatory Visit: Payer: Self-pay

## 2021-04-29 ENCOUNTER — Ambulatory Visit (HOSPITAL_COMMUNITY): Payer: Medicare PPO | Admitting: Physical Therapy

## 2021-04-29 DIAGNOSIS — M533 Sacrococcygeal disorders, not elsewhere classified: Secondary | ICD-10-CM

## 2021-04-29 DIAGNOSIS — S31000D Unspecified open wound of lower back and pelvis without penetration into retroperitoneum, subsequent encounter: Secondary | ICD-10-CM

## 2021-04-29 DIAGNOSIS — R600 Localized edema: Secondary | ICD-10-CM | POA: Diagnosis not present

## 2021-04-29 DIAGNOSIS — S31000S Unspecified open wound of lower back and pelvis without penetration into retroperitoneum, sequela: Secondary | ICD-10-CM | POA: Diagnosis not present

## 2021-04-29 NOTE — Therapy (Signed)
[place], [place], [address] Phone: [phone]   Fax:  [phone]  Wound Care Therapy  Patient Details  Name: Henry [name] MRN: [id number] Date of Birth: [birth year] Referring Provider (PT): Drenda Freeze   Encounter Date: 04/29/2021   PT End of Session - 04/29/21 0908     Visit Number 3    Number of Visits 12    Date for PT Re-Evaluation 06/05/21    Authorization Type [employer] put it request    PT Start Time 0830    PT Stop Time 0900    PT Time Calculation (min) 30 min    Activity Tolerance Patient tolerated treatment well    Behavior During Therapy [facility] for tasks assessed/performed             Past Medical History:  Diagnosis Date   Diabetes mellitus without complication ([place])    Hypercholesteremia    Hypertension     Past Surgical History:  Procedure Laterality Date   APPENDECTOMY     COLONOSCOPY N/A 02/25/2018   Procedure: COLONOSCOPY;  Surgeon: Danie Binder, MD;  [facility];  Service: Endoscopy;  Laterality: N/A;  8:30   Left leg surgery     NASAL SEPTUM SURGERY     NASAL SINUS SURGERY     POLYPECTOMY  02/25/2018   Procedure: POLYPECTOMY;  Surgeon: Danie Binder, MD;  [facility];  Service: Endoscopy;;  ascending x2, descending x2, hepatic flexure polyps x2   TONSILLECTOMY      There were no vitals filed for this visit.               Wound Therapy - 04/29/21 0001     Subjective Pt states that he is not as numb in that area as he has been    Patient and Family Stated Goals wound to heal    Date of Onset 02/03/21    Prior Treatments acute, HH, self    Pain Scale 0-10    Pain Score 3     Pain Type Chronic pain    Pain Location Buttocks    Pain Orientation Proximal    Patients Stated Pain Goal 0    Evaluation and Treatment Procedures Explained to Patient/Family Yes    Evaluation and Treatment Procedures agreed to    Wound Properties Date First  Assessed: 04/24/21 Time First Assessed: 1330 Wound Type: Other (Comment) Location: Sacrum Location Orientation: Lower Present on Admission: Yes   Site / Wound Assessment Clean;Dry;Pale    % Wound base Red or Granulating 15%    % Wound base Yellow/Fibrinous Exudate 85%    Peri-wound Assessment Intact    Drainage Amount None    Treatment Cleansed;Debridement (Selective)   decongestive techniqus completed to Lt lumbar and buttock area to decrease edema   Selective Debridement - Location sacral    Selective Debridement - Tools Used Scalpel    Selective Debridement - Tissue Removed eschar    Wound Therapy - Clinical Statement Pt has less redness on buttock area, noted increased granulation along edges of wound.  PT counseled on the need to sit tall when he is sitting to prevent extra pressure on wound area.    Wound Therapy - Functional Problem List unable to sit or lie supine or wound healing will be delayed    Factors Delaying/Impairing Wound Healing Diabetes Mellitus    Hydrotherapy Plan Debridement;Dressing change    Wound Therapy - Frequency 2X / week  6 weeks   Wound Therapy - Current Recommendations PT    Wound Plan continue to cleanse, debride and complete techniques to decrease edema as needed.    Dressing  medihoney and foam sacral dressing                       PT Short Term Goals - 04/29/21 0909       PT SHORT TERM GOAL #1   Title Wound to be 80% granulated    Time 3    Period Weeks    Status On-going    Target Date 05/15/21      PT SHORT TERM GOAL #2   Title Pain in sacral area to be no greater than a 2/10    Time 3    Period Weeks    Status On-going               PT Long Term Goals - 04/29/21 0910       PT LONG TERM GOAL #1   Title wound to be healed to allow pt to be able to sit and lie on his back    Time 6    Period Weeks    Status On-going      PT LONG TERM GOAL #2   Title PT to have no pain in his sacral area to allow ease of self  hygeine and dressing    Time 6    Period Weeks    Status On-going                   Plan - 04/29/21 0908     Clinical Impression Statement see above    Personal Factors and Comorbidities Comorbidity 1    Examination-Activity Limitations Bed Mobility;Hygiene/Grooming;Sit;Sleep    Stability/Clinical Decision Making Stable/Uncomplicated    Rehab Potential Good    PT Frequency 2x / week    PT Duration 6 weeks    PT Treatment/Interventions Other (comment);Patient/family education   debridement and dressing change   PT Next Visit Plan Take picture and measure wound.  debridement and dressing change; manual to Lt lumbar and buttock to decrease edema to assist in wound healing.    PT Home Exercise Plan proper sitting posture             Patient will benefit from skilled therapeutic intervention in order to improve the following deficits and impairments:  Pain, Increased edema, Decreased skin integrity  Visit Diagnosis: Localized edema  Wound of sacral region, subsequent encounter  Sacral pain     Problem List Patient Active Problem List   Diagnosis Date Noted   Pressure injury of skin 03/28/2021   Rhabdomyolysis 03/27/2021   Hypertension 03/27/2021   Hyponatremia 03/27/2021   Hyperlipidemia 03/27/2021   Type 2 diabetes mellitus ([place]) with noted hyperglycemia 03/27/2021   Elevated CK 03/27/2021   Elevated LFTs 03/27/2021   Acute kidney injury ([place]) 03/27/2021   Urinary retention 03/27/2021   Anemia 03/27/2021   Leukocytosis 03/27/2021   Do not resuscitate 03/27/2021   Constipation 08/02/2020   Rectal bleeding 08/02/2020   Personal history of colonic polyps    Henry [name], PT CLT [phone]  04/29/2021, 9:11 AM  [place], [place], [address] Phone: [phone]   Fax:  [phone]  Name: Henry [name] MRN: [id number] Date of Birth: 27-Sep-1945

## 2021-05-02 ENCOUNTER — Ambulatory Visit (HOSPITAL_COMMUNITY): Payer: Medicare PPO

## 2021-05-02 ENCOUNTER — Encounter (HOSPITAL_COMMUNITY): Payer: Self-pay

## 2021-05-02 ENCOUNTER — Other Ambulatory Visit: Payer: Self-pay

## 2021-05-02 DIAGNOSIS — R600 Localized edema: Secondary | ICD-10-CM

## 2021-05-02 DIAGNOSIS — S31000S Unspecified open wound of lower back and pelvis without penetration into retroperitoneum, sequela: Secondary | ICD-10-CM | POA: Diagnosis not present

## 2021-05-02 DIAGNOSIS — S31000D Unspecified open wound of lower back and pelvis without penetration into retroperitoneum, subsequent encounter: Secondary | ICD-10-CM

## 2021-05-02 DIAGNOSIS — M533 Sacrococcygeal disorders, not elsewhere classified: Secondary | ICD-10-CM | POA: Diagnosis not present

## 2021-05-02 NOTE — Therapy (Signed)
[place], [place], [address] Phone: [phone]   Fax:  [phone]  Wound Care Therapy  Patient Details  Name: Henry [name] MRN: [id number] Date of Birth: June 23, 1946 Referring Provider (PT): Drenda Freeze   Encounter Date: 05/02/2021   PT End of Session - 05/02/21 0804     Visit Number 4    Number of Visits 12    Date for PT Re-Evaluation 06/05/21    Authorization Type [employer] put it request    PT Start Time 0740    PT Stop Time 0803    PT Time Calculation (min) 23 min    Activity Tolerance Patient tolerated treatment well    Behavior During Therapy [facility] for tasks assessed/performed             Past Medical History:  Diagnosis Date   Diabetes mellitus without complication ([place])    Hypercholesteremia    Hypertension     Past Surgical History:  Procedure Laterality Date   APPENDECTOMY     COLONOSCOPY N/A 02/25/2018   Procedure: COLONOSCOPY;  Surgeon: Danie Binder, MD;  [facility];  Service: Endoscopy;  Laterality: N/A;  8:30   Left leg surgery     NASAL SEPTUM SURGERY     NASAL SINUS SURGERY     POLYPECTOMY  02/25/2018   Procedure: POLYPECTOMY;  Surgeon: Danie Binder, MD;  [facility];  Service: Endoscopy;;  ascending x2, descending x2, hepatic flexure polyps x2   TONSILLECTOMY      There were no vitals filed for this visit.    Subjective Assessment - 05/02/21 0801     Subjective No issues, has been trying to sit tall.                       Wound Therapy - 05/02/21 0001     Subjective No issues, has been trying to sit tall.    Patient and Family Stated Goals wound to heal    Date of Onset 02/03/21    Prior Treatments acute, HH, self    Pain Scale 0-10    Pain Score 0-No pain    Evaluation and Treatment Procedures Explained to Patient/Family Yes    Evaluation and Treatment Procedures agreed to    Wound Properties Date First Assessed: 04/24/21  Time First Assessed: 1330 Wound Type: Other (Comment) Location: Sacrum Location Orientation: Lower Present on Admission: Yes   Wound Image Images linked: 1    Site / Wound Assessment Clean;Dry;Pale    % Wound base Red or Granulating 25%    % Wound base Yellow/Fibrinous Exudate 75%    Peri-wound Assessment Intact    Wound Length (cm) 3.2 cm    Drainage Amount None    Treatment Cleansed;Debridement (Selective)    Selective Debridement - Location sacral    Selective Debridement - Tools Used Scalpel    Selective Debridement - Tissue Removed eschar    Wound Therapy - Clinical Statement Increased ease of removal of slough with medihoney assistance.  Improved granulation folloiwng debridement.  Continued with medihoney and sacral foam dressings.    Wound Therapy - Functional Problem List unable to sit or lie supine or wound healing will be delayed    Factors Delaying/Impairing Wound Healing Diabetes Mellitus    Hydrotherapy Plan Debridement;Dressing change    Wound Therapy - Frequency 2X / week    Wound Therapy - Current Recommendations PT    Wound Plan  continue to cleanse, debride and complete techniques to decrease edema as needed.    Dressing  medihoney and foam sacral dressing                       PT Short Term Goals - 04/29/21 0909       PT SHORT TERM GOAL #1   Title Wound to be 80% granulated    Time 3    Period Weeks    Status On-going    Target Date 05/15/21      PT SHORT TERM GOAL #2   Title Pain in sacral area to be no greater than a 2/10    Time 3    Period Weeks    Status On-going               PT Long Term Goals - 04/29/21 0910       PT LONG TERM GOAL #1   Title wound to be healed to allow pt to be able to sit and lie on his back    Time 6    Period Weeks    Status On-going      PT LONG TERM GOAL #2   Title PT to have no pain in his sacral area to allow ease of self hygeine and dressing    Time 6    Period Weeks    Status On-going                     Patient will benefit from skilled therapeutic intervention in order to improve the following deficits and impairments:     Visit Diagnosis: Localized edema  Sacral pain  Wound of sacral region, subsequent encounter     Problem List Patient Active Problem List   Diagnosis Date Noted   Pressure injury of skin 03/28/2021   Rhabdomyolysis 03/27/2021   Hypertension 03/27/2021   Hyponatremia 03/27/2021   Hyperlipidemia 03/27/2021   Type 2 diabetes mellitus ([place]) with noted hyperglycemia 03/27/2021   Elevated CK 03/27/2021   Elevated LFTs 03/27/2021   Acute kidney injury ([place]) 03/27/2021   Urinary retention 03/27/2021   Anemia 03/27/2021   Leukocytosis 03/27/2021   Do not resuscitate 03/27/2021   Constipation 08/02/2020   Rectal bleeding 08/02/2020   Personal history of colonic polyps    Ihor Austin, LPTA/CLT; CBIS [phone]  Aldona Lento 05/02/2021, 8:06 AM  [place], [place], [address] Phone: [phone]   Fax:  [phone]  Name: Henry [name] MRN: [id number] Date of Birth: 1946/09/15

## 2021-05-07 ENCOUNTER — Ambulatory Visit (HOSPITAL_COMMUNITY): Payer: Medicare PPO | Admitting: Physical Therapy

## 2021-05-07 ENCOUNTER — Encounter (HOSPITAL_COMMUNITY): Payer: Self-pay | Admitting: Physical Therapy

## 2021-05-07 ENCOUNTER — Other Ambulatory Visit: Payer: Self-pay

## 2021-05-07 DIAGNOSIS — S31000S Unspecified open wound of lower back and pelvis without penetration into retroperitoneum, sequela: Secondary | ICD-10-CM | POA: Diagnosis not present

## 2021-05-07 DIAGNOSIS — M533 Sacrococcygeal disorders, not elsewhere classified: Secondary | ICD-10-CM | POA: Diagnosis not present

## 2021-05-07 DIAGNOSIS — R600 Localized edema: Secondary | ICD-10-CM | POA: Diagnosis not present

## 2021-05-07 DIAGNOSIS — S31000D Unspecified open wound of lower back and pelvis without penetration into retroperitoneum, subsequent encounter: Secondary | ICD-10-CM | POA: Diagnosis not present

## 2021-05-07 NOTE — Therapy (Signed)
[place], [place], [address] Phone: [phone]   Fax:  [phone]  Wound Care Therapy  Patient Details  Name: Henry [name] MRN: [id number] Date of Birth: [birth year] Referring Provider (PT): Drenda Freeze   Encounter Date: 05/07/2021   PT End of Session - 05/07/21 0742     Visit Number 5    Number of Visits 12    Date for PT Re-Evaluation 06/05/21    Authorization Type [employer] put it request    PT Start Time 0743    PT Stop Time 0759    PT Time Calculation (min) 16 min    Activity Tolerance Patient tolerated treatment well    Behavior During Therapy [facility] for tasks assessed/performed             Past Medical History:  Diagnosis Date   Diabetes mellitus without complication ([place])    Hypercholesteremia    Hypertension     Past Surgical History:  Procedure Laterality Date   APPENDECTOMY     COLONOSCOPY N/A 02/25/2018   Procedure: COLONOSCOPY;  Surgeon: Danie Binder, MD;  [facility];  Service: Endoscopy;  Laterality: N/A;  8:30   Left leg surgery     NASAL SEPTUM SURGERY     NASAL SINUS SURGERY     POLYPECTOMY  02/25/2018   Procedure: POLYPECTOMY;  Surgeon: Danie Binder, MD;  [facility];  Service: Endoscopy;;  ascending x2, descending x2, hepatic flexure polyps x2   TONSILLECTOMY      There were no vitals filed for this visit.               Wound Therapy - 05/07/21 0001     Subjective No pain today, feeling good. Tired of side sleeping    Patient and Family Stated Goals wound to heal    Date of Onset 02/03/21    Prior Treatments acute, HH, self    Pain Score 0-No pain    Evaluation and Treatment Procedures Explained to Patient/Family Yes    Evaluation and Treatment Procedures agreed to    Wound Properties Date First Assessed: 04/24/21 Time First Assessed: 1330 Wound Type: Other (Comment) Location: Sacrum Location Orientation: Lower Present on Admission:  Yes   Wound Image Images linked: 1    Site / Wound Assessment Clean;Dry;Pale;Pink    % Wound base Red or Granulating 100%    Peri-wound Assessment Intact    Drainage Amount None    Treatment Cleansed;Debridement (Selective)    Selective Debridement - Location sacral    Selective Debridement - Tools Used Forceps    Selective Debridement - Tissue Removed slough    Wound Therapy - Clinical Statement Patient wound healing well with mosly scar tissue appearing. Upon further assessment, superior section of wound still open with minimal slough present. Patient tolerates debridment well and wound should be healed in next few sessions based on progress made. Patient will continue to benefit from skilled PT to promote wound healing.    Wound Therapy - Functional Problem List unable to sit or lie supine or wound healing will be delayed    Factors Delaying/Impairing Wound Healing Diabetes Mellitus    Hydrotherapy Plan Debridement;Dressing change    Wound Therapy - Frequency 2X / week    Wound Therapy - Current Recommendations PT    Wound Plan continue to cleanse, debride and complete techniques to decrease edema as needed.    Dressing  medihoney and foam sacral dressing  PT Short Term Goals - 04/29/21 0909       PT SHORT TERM GOAL #1   Title Wound to be 80% granulated    Time 3    Period Weeks    Status On-going    Target Date 05/15/21      PT SHORT TERM GOAL #2   Title Pain in sacral area to be no greater than a 2/10    Time 3    Period Weeks    Status On-going               PT Long Term Goals - 04/29/21 0910       PT LONG TERM GOAL #1   Title wound to be healed to allow pt to be able to sit and lie on his back    Time 6    Period Weeks    Status On-going      PT LONG TERM GOAL #2   Title PT to have no pain in his sacral area to allow ease of self hygeine and dressing    Time 6    Period Weeks    Status On-going                    Plan - 05/07/21 0742     Clinical Impression Statement see above    Personal Factors and Comorbidities Comorbidity 1    Examination-Activity Limitations Bed Mobility;Hygiene/Grooming;Sit;Sleep    Stability/Clinical Decision Making Stable/Uncomplicated    Rehab Potential Good    PT Frequency 2x / week    PT Duration 6 weeks    PT Treatment/Interventions Other (comment);Patient/family education   debridement and dressing change   PT Next Visit Plan debridement and dressing change; manual to Lt lumbar and buttock to decrease edema to assist in wound healing.    PT Home Exercise Plan proper sitting posture             Patient will benefit from skilled therapeutic intervention in order to improve the following deficits and impairments:  Pain, Increased edema, Decreased skin integrity  Visit Diagnosis: Localized edema  Sacral pain  Wound of sacral region, subsequent encounter     Problem List Patient Active Problem List   Diagnosis Date Noted   Pressure injury of skin 03/28/2021   Rhabdomyolysis 03/27/2021   Hypertension 03/27/2021   Hyponatremia 03/27/2021   Hyperlipidemia 03/27/2021   Type 2 diabetes mellitus ([place]) with noted hyperglycemia 03/27/2021   Elevated CK 03/27/2021   Elevated LFTs 03/27/2021   Acute kidney injury ([place]) 03/27/2021   Urinary retention 03/27/2021   Anemia 03/27/2021   Leukocytosis 03/27/2021   Do not resuscitate 03/27/2021   Constipation 08/02/2020   Rectal bleeding 08/02/2020   Personal history of colonic polyps     8:13 AM, 05/07/21 Mearl Latin PT, DPT Physical Therapist at [place], [place], [address] Phone: [phone]   Fax:  [phone]  Name: Henry [name] MRN: [id number] Date of Birth: [birth year]

## 2021-05-10 ENCOUNTER — Encounter (HOSPITAL_COMMUNITY): Payer: Self-pay

## 2021-05-10 ENCOUNTER — Ambulatory Visit (HOSPITAL_COMMUNITY): Payer: Medicare PPO

## 2021-05-10 ENCOUNTER — Other Ambulatory Visit: Payer: Self-pay

## 2021-05-10 DIAGNOSIS — M533 Sacrococcygeal disorders, not elsewhere classified: Secondary | ICD-10-CM

## 2021-05-10 DIAGNOSIS — R600 Localized edema: Secondary | ICD-10-CM | POA: Diagnosis not present

## 2021-05-10 DIAGNOSIS — S31000D Unspecified open wound of lower back and pelvis without penetration into retroperitoneum, subsequent encounter: Secondary | ICD-10-CM | POA: Diagnosis not present

## 2021-05-10 DIAGNOSIS — S31000S Unspecified open wound of lower back and pelvis without penetration into retroperitoneum, sequela: Secondary | ICD-10-CM | POA: Diagnosis not present

## 2021-05-10 NOTE — Therapy (Signed)
[place], [place], [address] Phone: [phone]   Fax:  [phone]  Wound Care Therapy  Patient Details  Name: Henry [name] MRN: [id number] Date of Birth: 08-Jun-1946 Referring Provider (PT): Drenda Freeze   Encounter Date: 05/10/2021   PT End of Session - 05/10/21 1516     Visit Number 6    Number of Visits 12    Date for PT Re-Evaluation 06/05/21    Authorization Type [employer]    Authorization Time Period no authorization required for wound care per [place]    PT Start Time 1455    PT Stop Time 1510    PT Time Calculation (min) 15 min    Activity Tolerance Patient tolerated treatment well    Behavior During Therapy [facility] for tasks assessed/performed             Past Medical History:  Diagnosis Date   Diabetes mellitus without complication ([place])    Hypercholesteremia    Hypertension     Past Surgical History:  Procedure Laterality Date   APPENDECTOMY     COLONOSCOPY N/A 02/25/2018   Procedure: COLONOSCOPY;  Surgeon: Danie Binder, MD;  [facility];  Service: Endoscopy;  Laterality: N/A;  8:30   Left leg surgery     NASAL SEPTUM SURGERY     NASAL SINUS SURGERY     POLYPECTOMY  02/25/2018   Procedure: POLYPECTOMY;  Surgeon: Danie Binder, MD;  [facility];  Service: Endoscopy;;  ascending x2, descending x2, hepatic flexure polyps x2   TONSILLECTOMY      There were no vitals filed for this visit.    Subjective Assessment - 05/10/21 1513     Subjective No issues, has been trying to sit tall.                       Wound Therapy - 05/10/21 0001     Subjective No issues, has been trying to sit tall.    Patient and Family Stated Goals wound to heal    Date of Onset 02/03/21    Prior Treatments acute, HH, self    Pain Scale 0-10    Pain Score 0-No pain    Evaluation and Treatment Procedures Explained to Patient/Family Yes    Evaluation and Treatment  Procedures agreed to    Wound Properties Date First Assessed: 04/24/21 Time First Assessed: 1330 Wound Type: Other (Comment) Location: Sacrum Location Orientation: Lower Present on Admission: Yes   Wound Image Images linked: 1    Site / Wound Assessment Clean;Dry;Pale;Pink    % Wound base Red or Granulating 100%    Peri-wound Assessment Intact    Drainage Amount None    Treatment Cleansed;Debridement (Selective)    Selective Debridement - Location sacral    Selective Debridement - Tools Used Forceps    Selective Debridement - Tissue Removed slough    Wound Therapy - Clinical Statement Wound presents with 100% granulation though not full healed yet.  Changed dressings to xeroform as no slough remaining.  No reports of pain through session.    Wound Therapy - Functional Problem List unable to sit or lie supine or wound healing will be delayed    Factors Delaying/Impairing Wound Healing Diabetes Mellitus    Hydrotherapy Plan Debridement;Dressing change    Wound Therapy - Frequency 2X / week    Wound Therapy - Current Recommendations PT    Wound Plan continue to  cleanse, debride and complete techniques to decrease edema as needed.    Dressing  xeroform and foam sacral dressing                       PT Short Term Goals - 04/29/21 0909       PT SHORT TERM GOAL #1   Title Wound to be 80% granulated    Time 3    Period Weeks    Status On-going    Target Date 05/15/21      PT SHORT TERM GOAL #2   Title Pain in sacral area to be no greater than a 2/10    Time 3    Period Weeks    Status On-going               PT Long Term Goals - 04/29/21 0910       PT LONG TERM GOAL #1   Title wound to be healed to allow pt to be able to sit and lie on his back    Time 6    Period Weeks    Status On-going      PT LONG TERM GOAL #2   Title PT to have no pain in his sacral area to allow ease of self hygeine and dressing    Time 6    Period Weeks    Status On-going                     Patient will benefit from skilled therapeutic intervention in order to improve the following deficits and impairments:     Visit Diagnosis: Localized edema  Sacral pain  Wound of sacral region, subsequent encounter     Problem List Patient Active Problem List   Diagnosis Date Noted   Pressure injury of skin 03/28/2021   Rhabdomyolysis 03/27/2021   Hypertension 03/27/2021   Hyponatremia 03/27/2021   Hyperlipidemia 03/27/2021   Type 2 diabetes mellitus ([place]) with noted hyperglycemia 03/27/2021   Elevated CK 03/27/2021   Elevated LFTs 03/27/2021   Acute kidney injury ([place]) 03/27/2021   Urinary retention 03/27/2021   Anemia 03/27/2021   Leukocytosis 03/27/2021   Do not resuscitate 03/27/2021   Constipation 08/02/2020   Rectal bleeding 08/02/2020   Personal history of colonic polyps    Ihor Austin, LPTA/CLT; CBIS [phone]  Aldona Lento 05/10/2021, 3:18 PM  [place], [place], [address] Phone: [phone]   Fax:  [phone]  Name: Henry [name] MRN: [id number] Date of Birth: May 09, 1946

## 2021-05-14 ENCOUNTER — Other Ambulatory Visit: Payer: Self-pay

## 2021-05-14 ENCOUNTER — Ambulatory Visit (HOSPITAL_COMMUNITY): Payer: Medicare PPO | Admitting: Physical Therapy

## 2021-05-14 DIAGNOSIS — S31000D Unspecified open wound of lower back and pelvis without penetration into retroperitoneum, subsequent encounter: Secondary | ICD-10-CM | POA: Diagnosis not present

## 2021-05-14 DIAGNOSIS — S31000S Unspecified open wound of lower back and pelvis without penetration into retroperitoneum, sequela: Secondary | ICD-10-CM

## 2021-05-14 DIAGNOSIS — R600 Localized edema: Secondary | ICD-10-CM

## 2021-05-14 NOTE — Therapy (Signed)
[place], [place], [address] Phone: [phone]   Fax:  [phone]  Wound Care Therapy  Patient Details  Name: Henry [name] MRN: [id number] Date of Birth: 1945-12-16 Referring Provider (PT): Drenda Freeze PHYSICAL THERAPY DISCHARGE SUMMARY  Visits from Start of Care: 7  Current functional level related to goals / functional outcomes: Wound is healed   Remaining deficits: Wound is healed   Education / Equipment: HEP   Patient agrees to discharge. Patient goals were met. Patient is being discharged due to meeting the stated rehab goals.   Encounter Date: 05/14/2021   PT End of Session - 05/14/21 0845     Visit Number 7    Number of Visits 7    Date for PT Re-Evaluation 06/05/21    Authorization Type [employer]    Authorization Time Period no authorization required for wound care per [facility]    PT Start Time 0830    PT Stop Time 0845    PT Time Calculation (min) 15 min             Past Medical History:  Diagnosis Date   Diabetes mellitus without complication ([place])    Hypercholesteremia    Hypertension     Past Surgical History:  Procedure Laterality Date   APPENDECTOMY     COLONOSCOPY N/A 02/25/2018   Procedure: COLONOSCOPY;  Surgeon: Danie Binder, MD;  [facility];  Service: Endoscopy;  Laterality: N/A;  8:30   Left leg surgery     NASAL SEPTUM SURGERY     NASAL SINUS SURGERY     POLYPECTOMY  02/25/2018   Procedure: POLYPECTOMY;  Surgeon: Danie Binder, MD;  [facility];  Service: Endoscopy;;  ascending x2, descending x2, hepatic flexure polyps x2   TONSILLECTOMY      There were no vitals filed for this visit.               Wound Therapy - 05/14/21 0001     Subjective Pt states that he has been pain free.    Patient and Family Stated Goals wound to heal    Date of Onset 02/03/21    Prior Treatments acute, HH, self    Pain Score 0-No pain     Evaluation and Treatment Procedures Explained to Patient/Family Yes    Evaluation and Treatment Procedures agreed to    Wound Properties Date First Assessed: 04/24/21 Time First Assessed: 1330 Wound Type: Other (Comment) Location: Sacrum Location Orientation: Lower Present on Admission: Yes   Wound Image Images linked: 1    Treatment --   wound is healed.   Wound Therapy - Clinical Statement Pt's wound has healed .  Therapist explained the importance of keeping pressure relief in this area as it will be prone to break down again.  Pt voiced understanding.  Therapist encouraged continued self massage as there continues to be some induration on the Rt side of pt upper butttock.    Wound Therapy - Functional Problem List none now    Factors Delaying/Impairing Wound Healing Diabetes Mellitus    Hydrotherapy Plan --   Discharge.   Wound Plan Discharge to self monitoring and care.    Dressing  none                       PT Short Term Goals - 05/14/21 0846       PT SHORT TERM GOAL #1  Title Wound to be 80% granulated    Time 3    Period Weeks    Status Achieved    Target Date 05/15/21      PT SHORT TERM GOAL #2   Title Pain in sacral area to be no greater than a 2/10    Time 3    Period Weeks    Status Achieved               PT Long Term Goals - 05/14/21 0846       PT LONG TERM GOAL #1   Title wound to be healed to allow pt to be able to sit and lie on his back    Time 6    Period Weeks    Status Achieved      PT LONG TERM GOAL #2   Title PT to have no pain in his sacral area to allow ease of self hygeine and dressing    Time 6    Period Weeks    Status Achieved                   Plan - 05/14/21 0846     Clinical Impression Statement see above    Personal Factors and Comorbidities Comorbidity 1    Examination-Activity Limitations Bed Mobility;Hygiene/Grooming;Sit;Sleep    Stability/Clinical Decision Making Stable/Uncomplicated    Rehab  Potential Good    PT Frequency 2x / week    PT Duration 6 weeks    PT Treatment/Interventions Other (comment);Patient/family education   debridement and dressing change   PT Next Visit Plan discharge    PT Home Exercise Plan proper sitting posture             Patient will benefit from skilled therapeutic intervention in order to improve the following deficits and impairments:  Pain, Increased edema, Decreased skin integrity  Visit Diagnosis: Localized edema  Sacral wound, sequela     Problem List Patient Active Problem List   Diagnosis Date Noted   Pressure injury of skin 03/28/2021   Rhabdomyolysis 03/27/2021   Hypertension 03/27/2021   Hyponatremia 03/27/2021   Hyperlipidemia 03/27/2021   Type 2 diabetes mellitus ([place]) with noted hyperglycemia 03/27/2021   Elevated CK 03/27/2021   Elevated LFTs 03/27/2021   Acute kidney injury ([place]) 03/27/2021   Urinary retention 03/27/2021   Anemia 03/27/2021   Leukocytosis 03/27/2021   Do not resuscitate 03/27/2021   Constipation 08/02/2020   Rectal bleeding 08/02/2020   Personal history of colonic polyps    Rayetta Humphrey, PT CLT [phone]  05/14/2021, 8:48 AM  [place], [place], [address] Phone: [phone]   Fax:  [phone]  Name: Henry [name] MRN: [id number] Date of Birth: 02-22-1946

## 2021-05-16 DIAGNOSIS — I10 Essential (primary) hypertension: Secondary | ICD-10-CM | POA: Diagnosis not present

## 2021-05-16 DIAGNOSIS — E119 Type 2 diabetes mellitus without complications: Secondary | ICD-10-CM | POA: Diagnosis not present

## 2021-05-17 ENCOUNTER — Ambulatory Visit (HOSPITAL_COMMUNITY): Payer: Medicare PPO | Admitting: Physical Therapy

## 2021-05-21 ENCOUNTER — Ambulatory Visit (HOSPITAL_COMMUNITY): Payer: Medicare PPO | Admitting: Physical Therapy

## 2021-05-23 ENCOUNTER — Ambulatory Visit (HOSPITAL_COMMUNITY): Payer: Medicare PPO | Admitting: Physical Therapy

## 2021-05-23 DIAGNOSIS — E118 Type 2 diabetes mellitus with unspecified complications: Secondary | ICD-10-CM | POA: Diagnosis not present

## 2021-05-23 DIAGNOSIS — E782 Mixed hyperlipidemia: Secondary | ICD-10-CM | POA: Diagnosis not present

## 2021-05-23 DIAGNOSIS — E113293 Type 2 diabetes mellitus with mild nonproliferative diabetic retinopathy without macular edema, bilateral: Secondary | ICD-10-CM | POA: Diagnosis not present

## 2021-05-23 DIAGNOSIS — E871 Hypo-osmolality and hyponatremia: Secondary | ICD-10-CM | POA: Diagnosis not present

## 2021-05-23 DIAGNOSIS — Z0001 Encounter for general adult medical examination with abnormal findings: Secondary | ICD-10-CM | POA: Diagnosis not present

## 2021-05-23 DIAGNOSIS — E875 Hyperkalemia: Secondary | ICD-10-CM | POA: Diagnosis not present

## 2021-05-23 DIAGNOSIS — E1139 Type 2 diabetes mellitus with other diabetic ophthalmic complication: Secondary | ICD-10-CM | POA: Diagnosis not present

## 2021-05-23 DIAGNOSIS — E039 Hypothyroidism, unspecified: Secondary | ICD-10-CM | POA: Diagnosis not present

## 2021-05-23 DIAGNOSIS — D509 Iron deficiency anemia, unspecified: Secondary | ICD-10-CM | POA: Diagnosis not present

## 2021-05-23 DIAGNOSIS — I10 Essential (primary) hypertension: Secondary | ICD-10-CM | POA: Diagnosis not present

## 2021-05-28 ENCOUNTER — Ambulatory Visit (HOSPITAL_COMMUNITY): Payer: Medicare PPO

## 2021-05-30 ENCOUNTER — Ambulatory Visit (HOSPITAL_COMMUNITY): Payer: Medicare PPO | Admitting: Physical Therapy

## 2021-06-04 ENCOUNTER — Ambulatory Visit (HOSPITAL_COMMUNITY): Payer: Medicare PPO | Admitting: Physical Therapy

## 2021-06-06 ENCOUNTER — Ambulatory Visit (HOSPITAL_COMMUNITY): Payer: Medicare PPO

## 2021-06-17 ENCOUNTER — Telehealth: Payer: Self-pay

## 2021-06-17 ENCOUNTER — Other Ambulatory Visit: Payer: Self-pay

## 2021-06-17 ENCOUNTER — Encounter: Payer: Self-pay | Admitting: Gastroenterology

## 2021-06-17 ENCOUNTER — Ambulatory Visit: Payer: Medicare PPO | Admitting: Gastroenterology

## 2021-06-17 VITALS — BP 147/64 | HR 58 | Temp 97.7°F | Ht 65.0 in | Wt 186.2 lb

## 2021-06-17 DIAGNOSIS — Z86010 Personal history of colonic polyps: Secondary | ICD-10-CM | POA: Diagnosis not present

## 2021-06-17 MED ORDER — PEG 3350-KCL-NA BICARB-NACL 420 G PO SOLR: 4000.0000 mL | ORAL | 0 refills | Status: DC

## 2021-06-17 NOTE — H&P (View-Only) (Signed)
Primary Care Physician:  Celene Squibb, MD  Primary Gastroenterologist:  Elon Alas. Abbey Chatters, DO   Chief Complaint  Patient presents with   Consult    Due for TCS d/t hx polyps    HPI:  Henry [name] is a 75 y.o. male here to schedule surveillance colonoscopy for h/o colon polyps.   Last colonoscopy in June 2019.  He had 5 simple adenomas removed.  Advised for surveillance colonoscopy in 3 years.  Last seen in the office in November 2021 constipation and rectal bleeding.  Today, patient states he is doing well from a GI standpoint.  Denies constipation.  Denies rectal bleeding.  No abdominal pain.  No upper GI symptoms.  No unintentional weight loss.  States his sugars are well controlled.   Current Outpatient Medications  Medication Sig Dispense Refill   cetirizine (ZYRTEC) 10 MG tablet Take 10 mg by mouth daily as needed for allergies.      levothyroxine (SYNTHROID) 88 MCG tablet Take 88 mcg by mouth daily.     metFORMIN (GLUCOPHAGE) 500 MG tablet Take 500mg  by mouth twice daily     Multiple Vitamins-Minerals (CENTRUM ADULTS PO) Take 1 tablet by mouth daily.     Omega-3 Fatty Acids (FISH OIL) 1200 MG CAPS Take 2,400 mg by mouth daily.     simvastatin (ZOCOR) 40 MG tablet Take 1 tablet (40 mg total) by mouth daily at 6 PM. 30 tablet    telmisartan (MICARDIS) 40 MG tablet Take 40 mg by mouth daily.     No current facility-administered medications for this visit.    Allergies as of 06/17/2021   (No Known Allergies)    Past Medical History:  Diagnosis Date   Diabetes mellitus without complication ([place])    Hypercholesteremia    Hypertension     Past Surgical History:  Procedure Laterality Date   APPENDECTOMY     COLONOSCOPY N/A 02/25/2018   Procedure: COLONOSCOPY;  Surgeon: Danie Binder, MD;  [facility];  Service: Endoscopy;  Laterality: N/A;  8:30   Left leg surgery     NASAL SEPTUM SURGERY     NASAL SINUS SURGERY     POLYPECTOMY  02/25/2018   Procedure:  POLYPECTOMY;  Surgeon: Danie Binder, MD;  [facility];  Service: Endoscopy;;  ascending x2, descending x2, hepatic flexure polyps x2   TONSILLECTOMY      Family History  Problem Relation Age of Onset   Colon cancer Neg Hx    Colon polyps Neg Hx     Social History   Socioeconomic History   Marital status: Single    Spouse name: Not on file   Number of children: Not on file   Years of education: Not on file   Highest education level: Not on file  Occupational History   Not on file  Tobacco Use   Smoking status: Former    Packs/day: 0.25    Years: 4.00    Pack years: 1.00    Types: Cigarettes   Smokeless tobacco: Never  Vaping Use   Vaping Use: Never used  Substance and Sexual Activity   Alcohol use: Yes    Alcohol/week: 12.0 standard drinks    Types: 12 Cans of beer per week   Drug use: Never   Sexual activity: Not on file  Other Topics Concern   Not on file  Social History Narrative   Not on file   Social Determinants of Health   Financial Resource Strain:  Not on file  Food Insecurity: Not on file  Transportation Needs: Not on file  Physical Activity: Not on file  Stress: Not on file  Social Connections: Not on file  Intimate Partner Violence: Not on file      ROS:  General: Negative for anorexia, weight loss, fever, chills, fatigue, weakness. Eyes: Negative for vision changes.  ENT: Negative for hoarseness, difficulty swallowing , nasal congestion. CV: Negative for chest pain, angina, palpitations, dyspnea on exertion, peripheral edema.  Respiratory: Negative for dyspnea at rest, dyspnea on exertion, cough, sputum, wheezing.  GI: See history of present illness. GU:  Negative for dysuria, hematuria, urinary incontinence, urinary frequency, nocturnal urination.  MS: Negative for joint pain, low back pain.  Derm: Negative for rash or itching.  Neuro: Negative for weakness, abnormal sensation, seizure, frequent headaches, memory loss,  confusion.  Psych: Negative for anxiety, depression, suicidal ideation, hallucinations.  Endo: Negative for unusual weight change.  Heme: Negative for bruising or bleeding. Allergy: Negative for rash or hives.    Physical Examination:  BP (!) 147/64   Pulse (!) 58   Temp 97.7 F (36.5 C)   Ht 5\' 5"  (1.651 m)   Wt 186 lb 3.2 oz (84.5 kg)   BMI 30.99 kg/m    General: Well-nourished, well-developed in no acute distress.  Head: Normocephalic, atraumatic.   Eyes: Conjunctiva pink, no icterus. Mouth:masked. Neck: Supple without thyromegaly, masses, or lymphadenopathy.  Lungs: Clear to auscultation bilaterally.  Heart: Regular rate and rhythm, no murmurs rubs or gallops.  Abdomen: Bowel sounds are normal, nontender, nondistended, no hepatosplenomegaly or masses, no abdominal bruits or    hernia , no rebound or guarding.   Rectal: not peformed Extremities: No lower extremity edema. No clubbing or deformities.  Neuro: Alert and oriented x 4 , grossly normal neurologically.  Skin: Warm and dry, no rash or jaundice.   Psych: Alert and cooperative, normal mood and affect.    Imaging Studies: No results found.   Assessment:  75 year old male with history of adenomatous colon polyps, due for 3-year surveillance colonoscopy at this time.  Denies GI symptoms.  Patient is interested in pursuing surveillance colonoscopy at this time.  Plan:  Colonoscopy with Dr. Abbey Chatters. ASA II.  I have discussed the risks, alternatives, benefits with regards to but not limited to the risk of reaction to medication, bleeding, infection, perforation and the patient is agreeable to proceed. Written consent to be obtained.

## 2021-06-17 NOTE — Progress Notes (Signed)
Primary Care Physician:  Celene Squibb, MD  Primary Gastroenterologist:  Elon Alas. Abbey Chatters, DO   Chief Complaint  Patient presents with   Consult    Due for TCS d/t hx polyps    HPI:  Henry [name] is a 75 y.o. male here to schedule surveillance colonoscopy for h/o colon polyps.   Last colonoscopy in June 2019.  He had 5 simple adenomas removed.  Advised for surveillance colonoscopy in 3 years.  Last seen in the office in November 2021 constipation and rectal bleeding.  Today, patient states he is doing well from a GI standpoint.  Denies constipation.  Denies rectal bleeding.  No abdominal pain.  No upper GI symptoms.  No unintentional weight loss.  States his sugars are well controlled.   Current Outpatient Medications  Medication Sig Dispense Refill   cetirizine (ZYRTEC) 10 MG tablet Take 10 mg by mouth daily as needed for allergies.      levothyroxine (SYNTHROID) 88 MCG tablet Take 88 mcg by mouth daily.     metFORMIN (GLUCOPHAGE) 500 MG tablet Take 500mg  by mouth twice daily     Multiple Vitamins-Minerals (CENTRUM ADULTS PO) Take 1 tablet by mouth daily.     Omega-3 Fatty Acids (FISH OIL) 1200 MG CAPS Take 2,400 mg by mouth daily.     simvastatin (ZOCOR) 40 MG tablet Take 1 tablet (40 mg total) by mouth daily at 6 PM. 30 tablet    telmisartan (MICARDIS) 40 MG tablet Take 40 mg by mouth daily.     No current facility-administered medications for this visit.    Allergies as of 06/17/2021   (No Known Allergies)    Past Medical History:  Diagnosis Date   Diabetes mellitus without complication ([place])    Hypercholesteremia    Hypertension     Past Surgical History:  Procedure Laterality Date   APPENDECTOMY     COLONOSCOPY N/A 02/25/2018   Procedure: COLONOSCOPY;  Surgeon: Danie Binder, MD;  Location: [facility];  Service: Endoscopy;  Laterality: N/A;  8:30   Left leg surgery     NASAL SEPTUM SURGERY     NASAL SINUS SURGERY     POLYPECTOMY  02/25/2018   Procedure:  POLYPECTOMY;  Surgeon: Danie Binder, MD;  Location: [facility];  Service: Endoscopy;;  ascending x2, descending x2, hepatic flexure polyps x2   TONSILLECTOMY      Family History  Problem Relation Age of Onset   Colon cancer Neg Hx    Colon polyps Neg Hx     Social History   Socioeconomic History   Marital status: Single    Spouse name: Not on file   Number of children: Not on file   Years of education: Not on file   Highest education level: Not on file  Occupational History   Not on file  Tobacco Use   Smoking status: Former    Packs/day: 0.25    Years: 4.00    Pack years: 1.00    Types: Cigarettes   Smokeless tobacco: Never  Vaping Use   Vaping Use: Never used  Substance and Sexual Activity   Alcohol use: Yes    Alcohol/week: 12.0 standard drinks    Types: 12 Cans of beer per week   Drug use: Never   Sexual activity: Not on file  Other Topics Concern   Not on file  Social History Narrative   Not on file   Social Determinants of Health   Financial Resource Strain:  Not on file  Food Insecurity: Not on file  Transportation Needs: Not on file  Physical Activity: Not on file  Stress: Not on file  Social Connections: Not on file  Intimate Partner Violence: Not on file      ROS:  General: Negative for anorexia, weight loss, fever, chills, fatigue, weakness. Eyes: Negative for vision changes.  ENT: Negative for hoarseness, difficulty swallowing , nasal congestion. CV: Negative for chest pain, angina, palpitations, dyspnea on exertion, peripheral edema.  Respiratory: Negative for dyspnea at rest, dyspnea on exertion, cough, sputum, wheezing.  GI: See history of present illness. GU:  Negative for dysuria, hematuria, urinary incontinence, urinary frequency, nocturnal urination.  MS: Negative for joint pain, low back pain.  Derm: Negative for rash or itching.  Neuro: Negative for weakness, abnormal sensation, seizure, frequent headaches, memory loss,  confusion.  Psych: Negative for anxiety, depression, suicidal ideation, hallucinations.  Endo: Negative for unusual weight change.  Heme: Negative for bruising or bleeding. Allergy: Negative for rash or hives.    Physical Examination:  BP (!) 147/64   Pulse (!) 58   Temp 97.7 F (36.5 C)   Ht 5\' 5"  (1.651 m)   Wt 186 lb 3.2 oz (84.5 kg)   BMI 30.99 kg/m    General: Well-nourished, well-developed in no acute distress.  Head: Normocephalic, atraumatic.   Eyes: Conjunctiva pink, no icterus. Mouth:masked. Neck: Supple without thyromegaly, masses, or lymphadenopathy.  Lungs: Clear to auscultation bilaterally.  Heart: Regular rate and rhythm, no murmurs rubs or gallops.  Abdomen: Bowel sounds are normal, nontender, nondistended, no hepatosplenomegaly or masses, no abdominal bruits or    hernia , no rebound or guarding.   Rectal: not peformed Extremities: No lower extremity edema. No clubbing or deformities.  Neuro: Alert and oriented x 4 , grossly normal neurologically.  Skin: Warm and dry, no rash or jaundice.   Psych: Alert and cooperative, normal mood and affect.    Imaging Studies: No results found.   Assessment:  75 year old male with history of adenomatous colon polyps, due for 3-year surveillance colonoscopy at this time.  Denies GI symptoms.  Patient is interested in pursuing surveillance colonoscopy at this time.  Plan:  Colonoscopy with Dr. Abbey Chatters. ASA II.  I have discussed the risks, alternatives, benefits with regards to but not limited to the risk of reaction to medication, bleeding, infection, perforation and the patient is agreeable to proceed. Written consent to be obtained.

## 2021-06-17 NOTE — Patient Instructions (Signed)
Colonoscopy as scheduled. See separate instructions.  

## 2021-06-17 NOTE — Telephone Encounter (Signed)
PA for TCS submitted via [employer] website. Case went to clinical review. Procedure tracking# [id number].

## 2021-06-19 ENCOUNTER — Other Ambulatory Visit: Payer: Self-pay

## 2021-06-19 NOTE — Telephone Encounter (Signed)
Called [employer], spoke to [place], TCS approved. [employer]# [id number], valid 07/02/21-08/01/21.

## 2021-06-20 ENCOUNTER — Telehealth: Payer: Self-pay | Admitting: Internal Medicine

## 2021-06-20 NOTE — Telephone Encounter (Signed)
Called pt, questions answered. °

## 2021-06-20 NOTE — Telephone Encounter (Signed)
PT LMOM that he needed to speak to someone about his prep. [phone]

## 2021-07-02 ENCOUNTER — Other Ambulatory Visit: Payer: Self-pay

## 2021-07-02 ENCOUNTER — Ambulatory Visit (HOSPITAL_COMMUNITY): Payer: Medicare PPO | Admitting: Anesthesiology

## 2021-07-02 ENCOUNTER — Encounter (HOSPITAL_COMMUNITY): Payer: Self-pay

## 2021-07-02 ENCOUNTER — Ambulatory Visit (HOSPITAL_COMMUNITY)
Admission: RE | Admit: 2021-07-02 | Discharge: 2021-07-02 | Disposition: A | Discharge status: Home / Self Care | Payer: Medicare PPO | Attending: Internal Medicine | Admitting: Internal Medicine

## 2021-07-02 ENCOUNTER — Encounter (HOSPITAL_COMMUNITY)
Admission: RE | Disposition: A | Discharge status: Home / Self Care | Payer: Self-pay | Source: Home / Self Care | Attending: Internal Medicine

## 2021-07-02 DIAGNOSIS — K5730 Diverticulosis of large intestine without perforation or abscess without bleeding: Secondary | ICD-10-CM | POA: Insufficient documentation

## 2021-07-02 DIAGNOSIS — E7800 Pure hypercholesterolemia, unspecified: Secondary | ICD-10-CM | POA: Insufficient documentation

## 2021-07-02 DIAGNOSIS — Z7984 Long term (current) use of oral hypoglycemic drugs: Secondary | ICD-10-CM | POA: Diagnosis not present

## 2021-07-02 DIAGNOSIS — D125 Benign neoplasm of sigmoid colon: Secondary | ICD-10-CM | POA: Insufficient documentation

## 2021-07-02 DIAGNOSIS — Z87891 Personal history of nicotine dependence: Secondary | ICD-10-CM | POA: Diagnosis not present

## 2021-07-02 DIAGNOSIS — E119 Type 2 diabetes mellitus without complications: Secondary | ICD-10-CM | POA: Insufficient documentation

## 2021-07-02 DIAGNOSIS — I10 Essential (primary) hypertension: Secondary | ICD-10-CM | POA: Insufficient documentation

## 2021-07-02 DIAGNOSIS — K648 Other hemorrhoids: Secondary | ICD-10-CM | POA: Insufficient documentation

## 2021-07-02 DIAGNOSIS — Z79899 Other long term (current) drug therapy: Secondary | ICD-10-CM | POA: Insufficient documentation

## 2021-07-02 DIAGNOSIS — Z86010 Personal history of colonic polyps: Secondary | ICD-10-CM | POA: Insufficient documentation

## 2021-07-02 DIAGNOSIS — K635 Polyp of colon: Secondary | ICD-10-CM | POA: Diagnosis not present

## 2021-07-02 DIAGNOSIS — Z1211 Encounter for screening for malignant neoplasm of colon: Secondary | ICD-10-CM | POA: Diagnosis not present

## 2021-07-02 HISTORY — PX: COLONOSCOPY WITH PROPOFOL: SHX5780

## 2021-07-02 HISTORY — PX: POLYPECTOMY: SHX5525

## 2021-07-02 LAB — GLUCOSE, CAPILLARY: Glucose-Capillary: 92 mg/dL (ref 70–99)

## 2021-07-02 SURGERY — COLONOSCOPY WITH PROPOFOL
Anesthesia: General

## 2021-07-02 MED ORDER — LACTATED RINGERS IV SOLN: INTRAVENOUS | Status: DC

## 2021-07-02 MED ORDER — PROPOFOL 500 MG/50ML IV EMUL
INTRAVENOUS | Status: DC | PRN
  Administered 2021-07-02: 125 ug/kg/min via INTRAVENOUS

## 2021-07-02 MED ORDER — STERILE WATER FOR IRRIGATION IR SOLN
Status: DC | PRN
  Administered 2021-07-02: 200 mL

## 2021-07-02 MED ORDER — PROPOFOL 10 MG/ML IV BOLUS
INTRAVENOUS | Status: DC | PRN
  Administered 2021-07-02: 100 mg via INTRAVENOUS

## 2021-07-02 NOTE — Anesthesia Preprocedure Evaluation (Signed)
Anesthesia Evaluation  Patient identified by MRN, date of birth, ID band Patient awake    Reviewed: Allergy & Precautions, NPO status , Patient's Chart, lab work & pertinent test results  Airway Mallampati: III  TM Distance: >3 FB Neck ROM: Full    Dental  (+) Missing, Dental Advisory Given   Pulmonary former smoker,    Pulmonary exam normal breath sounds clear to auscultation       Cardiovascular Exercise Tolerance: Good hypertension, Pt. on medications Normal cardiovascular exam Rhythm:Regular Rate:Normal     Neuro/Psych  Neuromuscular disease negative psych ROS   GI/Hepatic negative GI ROS, Neg liver ROS,   Endo/Other  diabetes, Well Controlled, Type 2, Oral Hypoglycemic Agents  Renal/GU Renal disease     Musculoskeletal negative musculoskeletal ROS (+)   Abdominal   Peds  Hematology  (+) anemia ,   Anesthesia Other Findings   Reproductive/Obstetrics negative OB ROS                             Anesthesia Physical Anesthesia Plan  ASA: 2  Anesthesia Plan: General   Post-op Pain Management:    Induction: Intravenous  PONV Risk Score and Plan: TIVA  Airway Management Planned: Nasal Cannula and Natural Airway  Additional Equipment:   Intra-op Plan:   Post-operative Plan:   Informed Consent: I have reviewed the patients History and Physical, chart, labs and discussed the procedure including the risks, benefits and alternatives for the proposed anesthesia with the patient or authorized representative who has indicated his/her understanding and acceptance.     Dental advisory given  Plan Discussed with: CRNA and Surgeon  Anesthesia Plan Comments:         Anesthesia Quick Evaluation

## 2021-07-02 NOTE — Discharge Instructions (Addendum)
  Colonoscopy Discharge Instructions  Read the instructions outlined below and refer to this sheet in the next few weeks. These discharge instructions provide you with general information on caring for yourself after you leave the hospital. Your doctor may also give you specific instructions. While your treatment has been planned according to the most current medical practices available, unavoidable complications occasionally occur.   ACTIVITY You may resume your regular activity, but move at a slower pace for the next 24 hours.  Take frequent rest periods for the next 24 hours.  Walking will help get rid of the air and reduce the bloated feeling in your belly (abdomen).  No driving for 24 hours (because of the medicine (anesthesia) used during the test).   Do not sign any important legal documents or operate any machinery for 24 hours (because of the anesthesia used during the test).  NUTRITION Drink plenty of fluids.  You may resume your normal diet as instructed by your doctor.  Begin with a light meal and progress to your normal diet. Heavy or fried foods are harder to digest and may make you feel sick to your stomach (nauseated).  Avoid alcoholic beverages for 24 hours or as instructed.  MEDICATIONS You may resume your normal medications unless your doctor tells you otherwise.  WHAT YOU CAN EXPECT TODAY Some feelings of bloating in the abdomen.  Passage of more gas than usual.  Spotting of blood in your stool or on the toilet paper.  IF YOU HAD POLYPS REMOVED DURING THE COLONOSCOPY: No aspirin products for 7 days or as instructed.  No alcohol for 7 days or as instructed.  Eat a soft diet for the next 24 hours.  FINDING OUT THE RESULTS OF YOUR TEST Not all test results are available during your visit. If your test results are not back during the visit, make an appointment with your caregiver to find out the results. Do not assume everything is normal if you have not heard from your  caregiver or the medical facility. It is important for you to follow up on all of your test results.  SEEK IMMEDIATE MEDICAL ATTENTION IF: You have more than a spotting of blood in your stool.  Your belly is swollen (abdominal distention).  You are nauseated or vomiting.  You have a temperature over 101.  You have abdominal pain or discomfort that is severe or gets worse throughout the day.   Your colonoscopy revealed 1 polyp(s) which I removed successfully. Await pathology results, my office will contact you. I recommend repeating colonoscopy in 5 years for surveillance purposes.   You also have diverticulosis and internal hemorrhoids. I would recommend increasing fiber in your diet or adding OTC Benefiber/Metamucil. Be sure to drink at least 4 to 6 glasses of water daily. Follow-up with GI as needed.   I hope you have a great rest of your week!  Charles K. Carver, D.O. Gastroenterology and Hepatology [facility]  

## 2021-07-02 NOTE — Op Note (Signed)
[facility] Patient Name: Henry [name] Procedure Date: 07/02/2021 8:09 AM MRN: [id number] Date of Birth: Jun 24, 1946 Attending MD: Elon Alas. Abbey Chatters DO CSN: [id number] Age: 75 Admit Type: Outpatient Procedure:                Colonoscopy Indications:              Surveillance: Personal history of adenomatous                            polyps on last colonoscopy 3 years ago Providers:                Elon Alas. Abbey Chatters, DO, Caprice Kluver, [place]                            Risa Grill, Technician Referring MD:              Medicines:                See the Anesthesia note for documentation of the                            administered medications Complications:            No immediate complications. Estimated Blood Loss:     Estimated blood loss was minimal. Procedure:                Pre-Anesthesia Assessment:                           - The anesthesia plan was to use monitored                            anesthesia care (MAC).                           After obtaining informed consent, the colonoscope                            was passed under direct vision. Throughout the                            procedure, the patient's blood pressure, pulse, and                            oxygen saturations were monitored continuously. The                            [id number]-[id number] ([id number]) was introduced through the                            anus and advanced to the the cecum, identified by                            appendiceal orifice and ileocecal valve. The                            colonoscopy was performed without difficulty.  The                            patient tolerated the procedure well. The quality                            of the bowel preparation was evaluated using the                            BBPS [facility] Bowel Preparation Scale) with scores                            of: Right Colon = 3, Transverse Colon = 3 and Left                            Colon = 3 (entire mucosa  seen well with no residual                            staining, small fragments of stool or opaque                            liquid). The total BBPS score equals 9. Scope In: 8:18:16 AM Scope Out: 8:36:25 AM Scope Withdrawal Time: 0 hours 13 minutes 46 seconds  Total Procedure Duration: 0 hours 18 minutes 9 seconds  Findings:      The perianal and digital rectal examinations were normal.      Non-bleeding internal hemorrhoids were found during endoscopy.      Multiple small-mouthed diverticula were found in the sigmoid colon.      A 7 mm polyp was found in the sigmoid colon. The polyp was       semi-pedunculated. The polyp was removed with a cold snare. Resection       and retrieval were complete.      The exam was otherwise without abnormality. Impression:               - Non-bleeding internal hemorrhoids.                           - Diverticulosis in the sigmoid colon.                           - One 7 mm polyp in the sigmoid colon, removed with                            a cold snare. Resected and retrieved.                           - The examination was otherwise normal. Moderate Sedation:      Per Anesthesia Care Recommendation:           - Patient has a contact number available for                            emergencies. The signs and symptoms of potential  delayed complications were discussed with the                            patient. Return to normal activities tomorrow.                            Written discharge instructions were provided to the                            patient.                           - Resume previous diet.                           - Continue present medications.                           - Await pathology results.                           - Repeat colonoscopy in 5 years for surveillance.                           - Return to GI clinic PRN. Procedure Code(s):        --- Professional ---                            [phone], Colonoscopy, flexible; with removal of                            tumor(s), polyp(s), or other lesion(s) by snare                            technique Diagnosis Code(s):        --- Professional ---                           Z86.010, Personal history of colonic polyps                           K64.8, Other hemorrhoids                           K63.5, Polyp of colon                           K57.30, Diverticulosis of large intestine without                            perforation or abscess without bleeding CPT copyright 2019 American Medical Association. All rights reserved. The codes documented in this report are preliminary and upon coder review may  be revised to meet current compliance requirements. Elon Alas. Abbey Chatters, DO [place] Abbey Chatters, DO 07/02/2021 8:38:27 AM This report has been signed electronically. Number of Addenda: 0

## 2021-07-02 NOTE — Transfer of Care (Signed)
Immediate Anesthesia Transfer of Care Note  Patient: Henry [name]  Procedure(s) Performed: COLONOSCOPY WITH PROPOFOL POLYPECTOMY  Patient Location: Endoscopy Unit  Anesthesia Type:General  Level of Consciousness: awake and alert   Airway & Oxygen Therapy: Patient Spontanous Breathing  Post-op Assessment: Report given to RN and Post -op Vital signs reviewed and stable  Post vital signs: Reviewed and stable  Last Vitals:  Vitals Value Taken Time  BP    Temp    Pulse    Resp    SpO2      Last Pain:  Vitals:   07/02/21 0815  TempSrc:   PainSc: 0-No pain         Complications: No notable events documented.

## 2021-07-02 NOTE — Anesthesia Postprocedure Evaluation (Signed)
Anesthesia Post Note  Patient: Henry [name]  Procedure(s) Performed: COLONOSCOPY WITH PROPOFOL POLYPECTOMY  Patient location during evaluation: Endoscopy Anesthesia Type: General Level of consciousness: awake and alert and oriented Pain management: pain level controlled Vital Signs Assessment: post-procedure vital signs reviewed and stable Respiratory status: spontaneous breathing and respiratory function stable Cardiovascular status: blood pressure returned to baseline and stable Postop Assessment: no apparent nausea or vomiting Anesthetic complications: no   No notable events documented.   Last Vitals:  Vitals:   07/02/21 0717 07/02/21 0841  BP: (!) 177/74 120/61  Pulse: 64 77  Resp: (!) 21 14  Temp:  36.6 C  SpO2: 98% 99%    Last Pain:  Vitals:   07/02/21 0841  TempSrc: Oral  PainSc: 0-No pain                 Shiela Bruns C Keylon Labelle

## 2021-07-02 NOTE — Interval H&P Note (Signed)
History and Physical Interval Note:  07/02/2021 7:49 AM  Henry [name]  has presented today for surgery, with the diagnosis of history of colon polyps.  The various methods of treatment have been discussed with the patient and family. After consideration of risks, benefits and other options for treatment, the patient has consented to  Procedure(s) with comments: COLONOSCOPY WITH PROPOFOL (N/A) - 8:00am as a surgical intervention.  The patient's history has been reviewed, patient examined, no change in status, stable for surgery.  I have reviewed the patient's chart and labs.  Questions were answered to the patient's satisfaction.     Eloise Harman

## 2021-07-03 LAB — SURGICAL PATHOLOGY

## 2021-07-05 ENCOUNTER — Encounter (HOSPITAL_COMMUNITY): Payer: Self-pay | Admitting: Internal Medicine

## 2021-09-18 DIAGNOSIS — E118 Type 2 diabetes mellitus with unspecified complications: Secondary | ICD-10-CM | POA: Diagnosis not present

## 2021-09-18 DIAGNOSIS — E039 Hypothyroidism, unspecified: Secondary | ICD-10-CM | POA: Diagnosis not present

## 2021-09-18 DIAGNOSIS — E782 Mixed hyperlipidemia: Secondary | ICD-10-CM | POA: Diagnosis not present

## 2021-09-26 DIAGNOSIS — E871 Hypo-osmolality and hyponatremia: Secondary | ICD-10-CM | POA: Diagnosis not present

## 2021-09-26 DIAGNOSIS — E875 Hyperkalemia: Secondary | ICD-10-CM | POA: Diagnosis not present

## 2021-09-26 DIAGNOSIS — E118 Type 2 diabetes mellitus with unspecified complications: Secondary | ICD-10-CM | POA: Diagnosis not present

## 2021-09-26 DIAGNOSIS — D509 Iron deficiency anemia, unspecified: Secondary | ICD-10-CM | POA: Diagnosis not present

## 2021-09-26 DIAGNOSIS — E039 Hypothyroidism, unspecified: Secondary | ICD-10-CM | POA: Diagnosis not present

## 2021-09-26 DIAGNOSIS — E782 Mixed hyperlipidemia: Secondary | ICD-10-CM | POA: Diagnosis not present

## 2021-09-26 DIAGNOSIS — L719 Rosacea, unspecified: Secondary | ICD-10-CM | POA: Diagnosis not present

## 2021-09-26 DIAGNOSIS — I10 Essential (primary) hypertension: Secondary | ICD-10-CM | POA: Diagnosis not present

## 2021-10-14 IMAGING — US US RENAL
1 series · 14 of 25 positions shown · non-contrast
Comparison: None.

CLINICAL DATA: Urinary retention.  Acute kidney injury.

EXAM:
RENAL / URINARY TRACT ULTRASOUND COMPLETE

[Series 1: us renal · 14 of 53 slices shown]
[im 1/53]
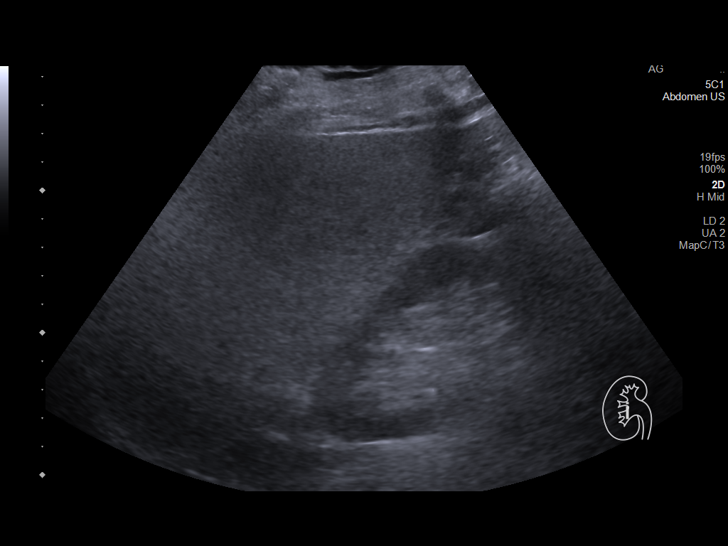
[im 5/53]
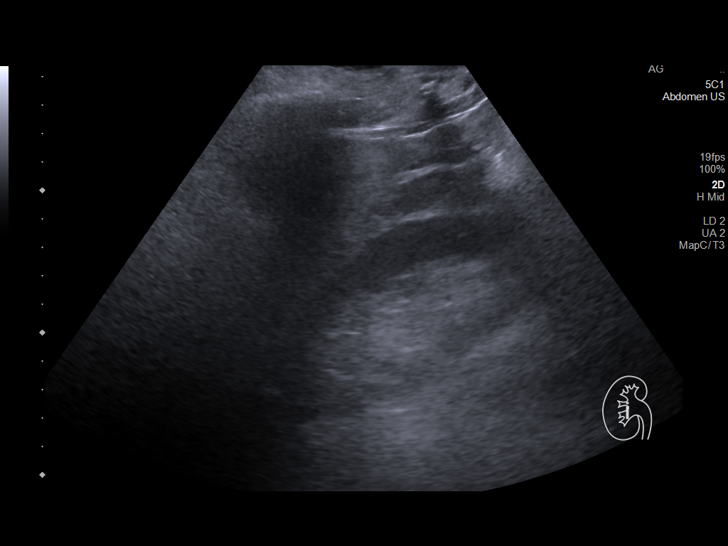
[im 9/53]
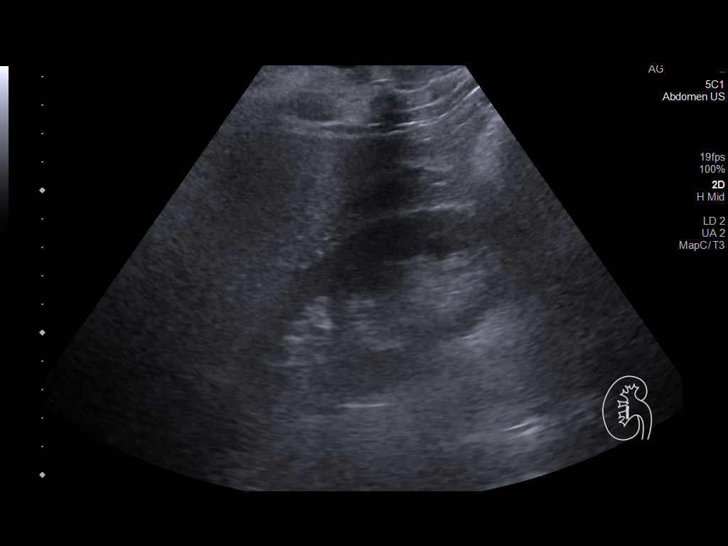
[im 14/53]
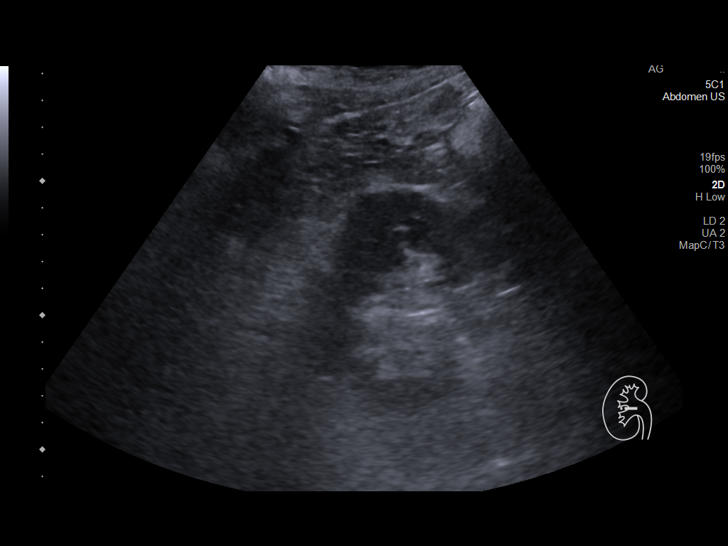
[im 18/53]
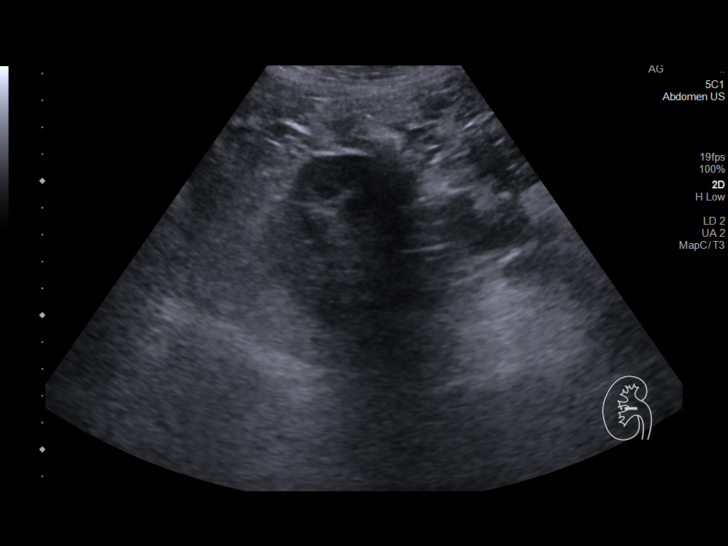
[im 20/53]
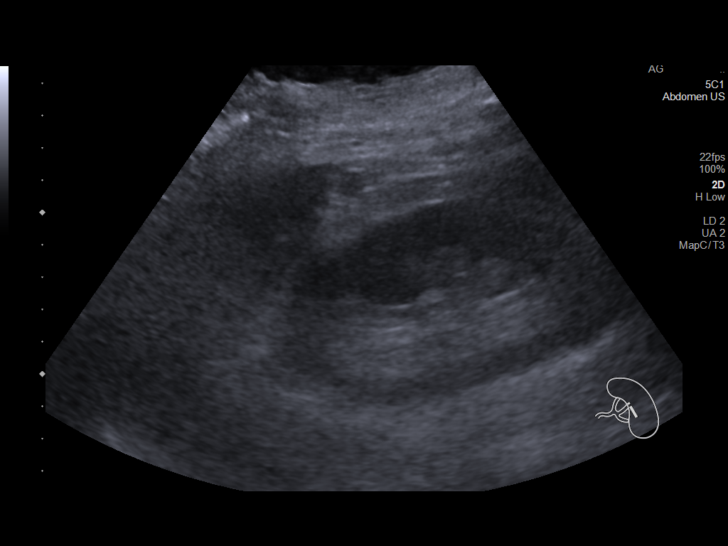
[im 24/53]
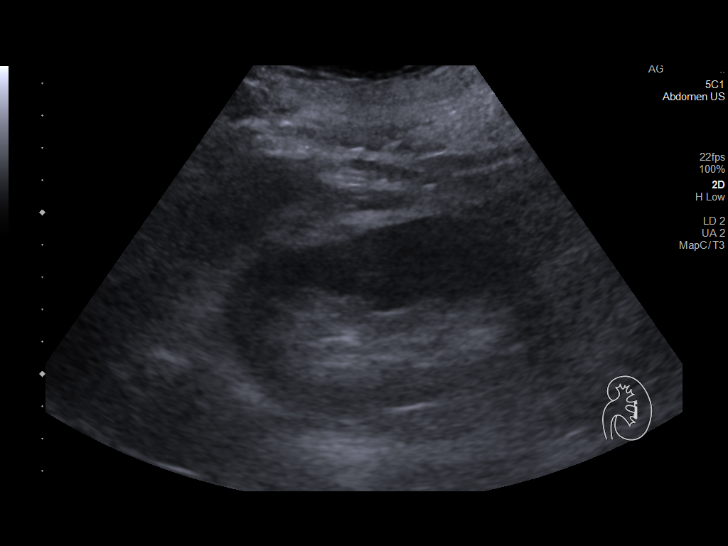
[im 29/53]
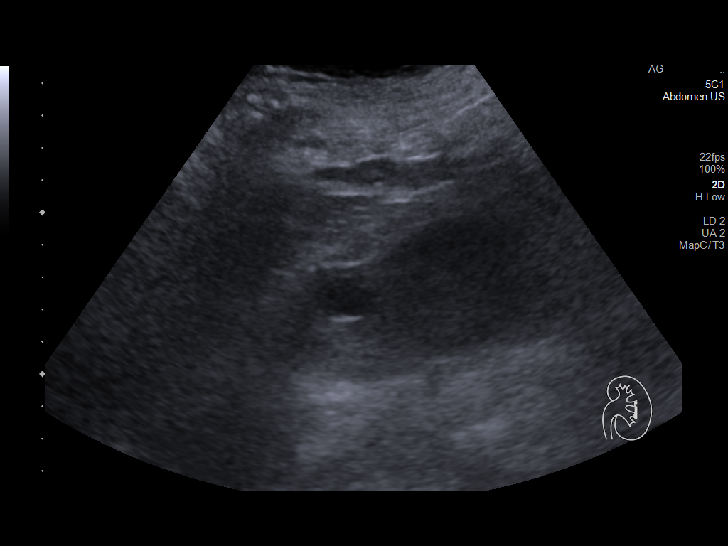
[im 33/53]
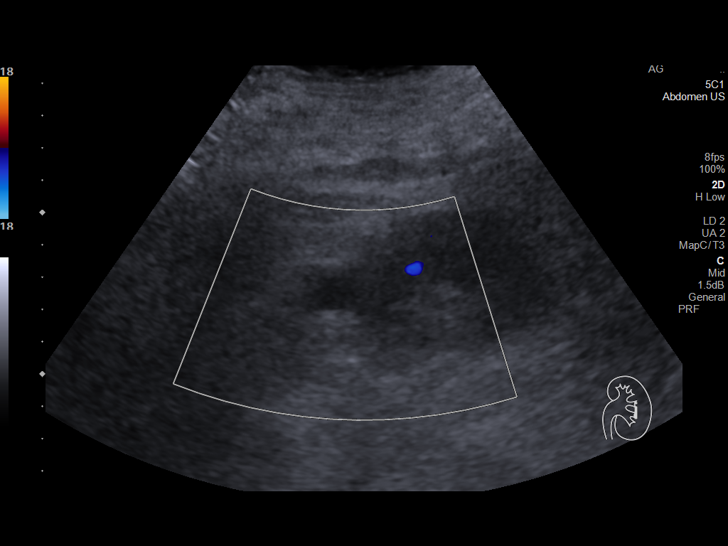
[im 35/53]
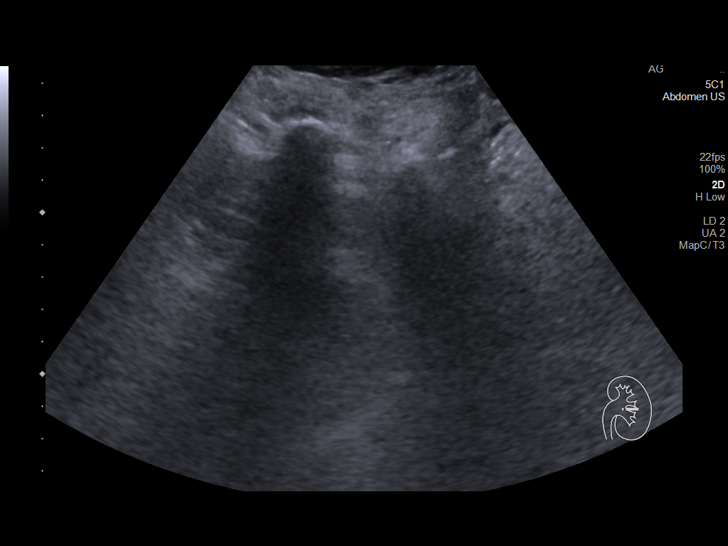
[im 40/53]
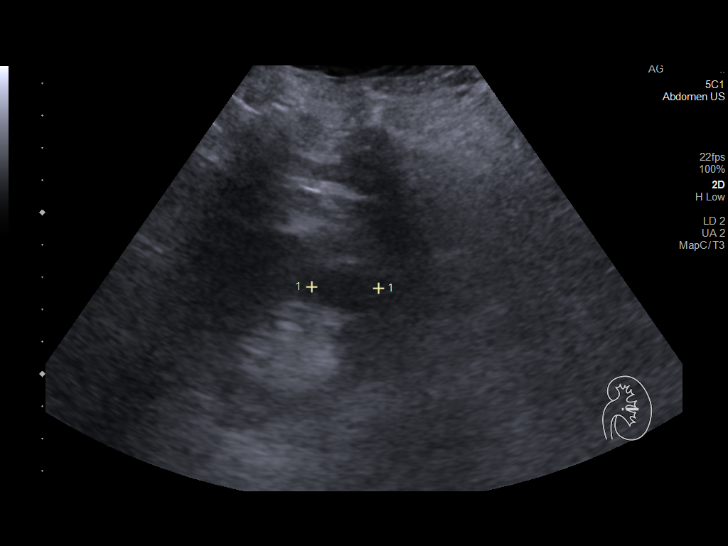
[im 44/53]
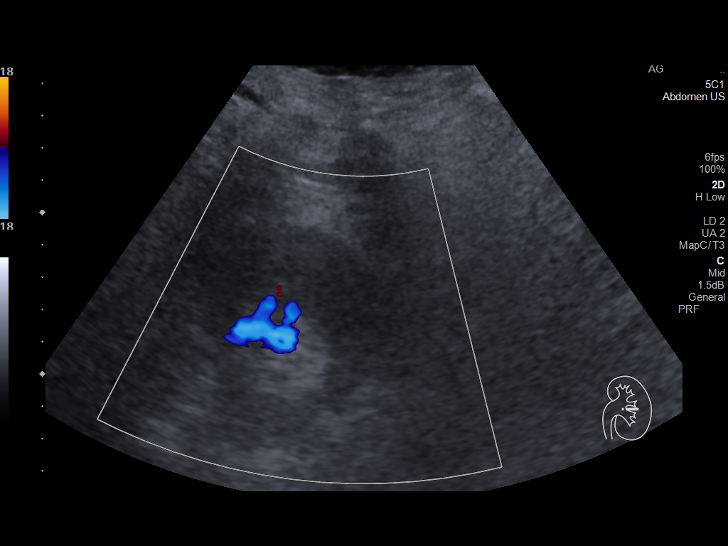
[im 48/53]
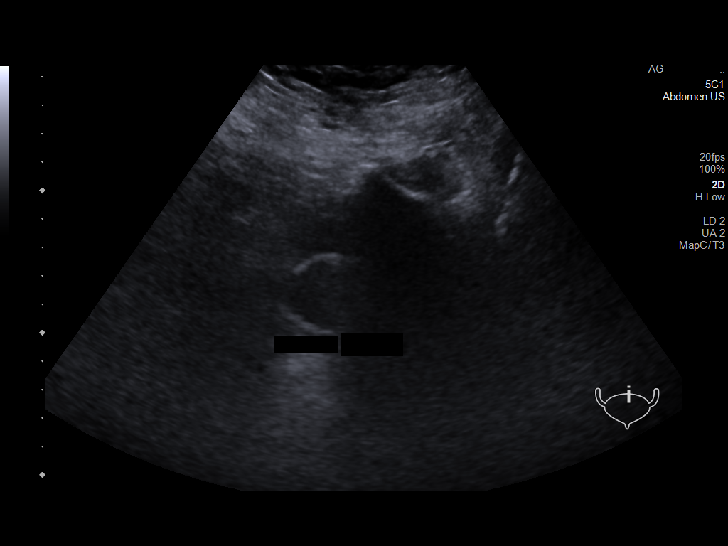
[im 53/53]
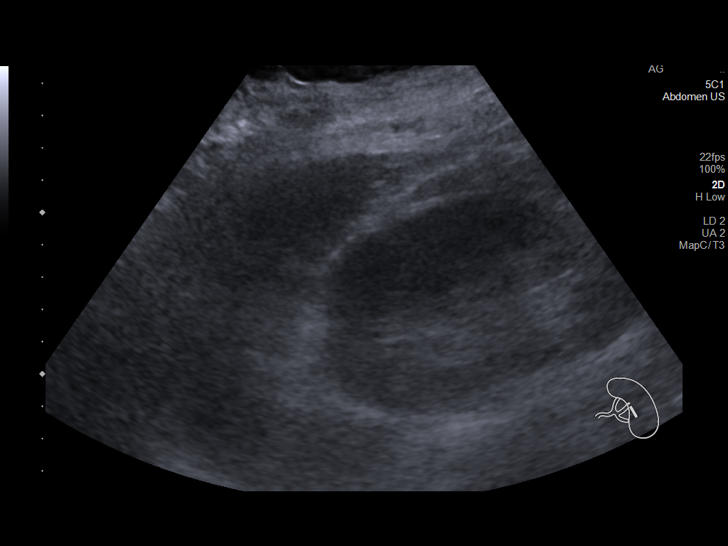

[14 of 25 positions shown; findings below may reference images not displayed]

FINDINGS: Right Kidney:

Renal measurements: 11.4 x 5.3 x 6.5 cm = volume: 208.3 mL.
Echogenicity within normal limits. No mass or hydronephrosis
visualized.

Left Kidney:

Renal measurements: 10.6 x 5.8 x 5.1 cm = volume: 162.9 mL.
Echogenicity within normal limits. Simple 2.2 cm cyst upper pole
noted. No solid mass or hydronephrosis visualized.

Bladder:

Completely decompressed with a Foley catheter in place.

Other:

None.
IMPRESSION: No acute abnormality.  Negative for hydronephrosis.

2.2 cm left renal cyst noted.

## 2021-10-14 IMAGING — DX DG CHEST 1V PORT
1 series · 1 of 1 positions shown · non-contrast
Comparison: No prior.

CLINICAL DATA: History of fall.  Weakness.

EXAM:
PORTABLE CHEST 1 VIEW

[chest ap]
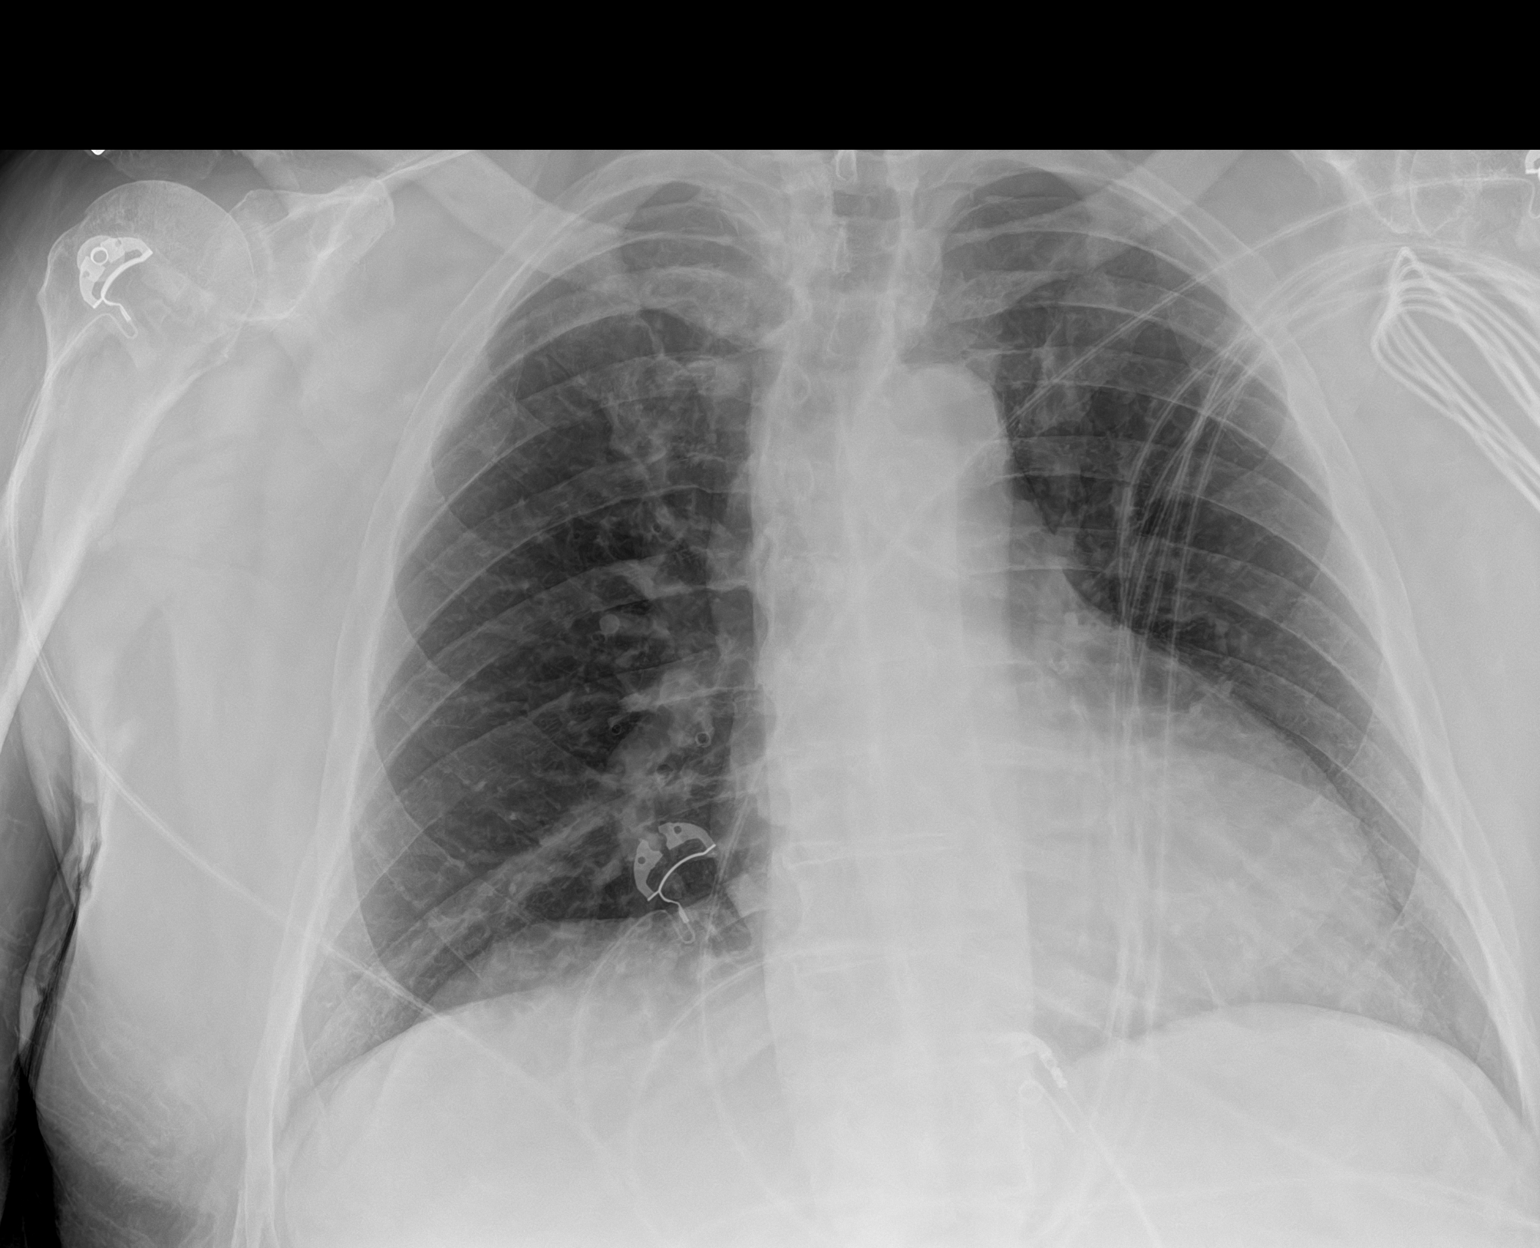

[1 of 1 positions shown; findings below may reference images not displayed]

FINDINGS: Cardiomegaly. No pulmonary venous congestion. No focal infiltrate.
No pleural effusion or pneumothorax. Degenerative changes scoliosis
thoracic spine. Degenerative changes both shoulders no displaced rib
fracture.
IMPRESSION: 1. Cardiomegaly. No pulmonary venous congestion. No acute pulmonary
disease.

2.  No evidence of displaced rib fracture or pneumothorax.

## 2021-10-14 IMAGING — CT CT HEAD W/O CM
3 series · 16 of 47 positions shown, 19 images · non-contrast
Comparison: No priors.

CLINICAL DATA: 75-year-old male with history of trauma from a fall
out of a chair.

EXAM:
CT HEAD WITHOUT CONTRAST
TECHNIQUE: Contiguous axial images were obtained from the base of the skull
through the vertex without intravenous contrast.

[Series 2: head w o · axial · 0.39mm/px · z∈[+1107,+1247]mm · 10 of 34 slices shown, 13 images]
[im 3/34  brain]
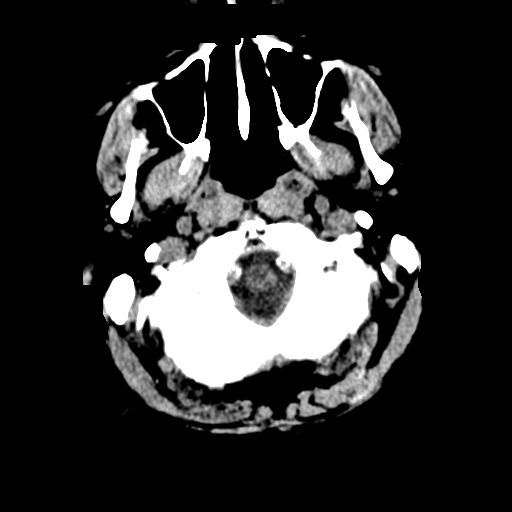
[im 3/34  bone]
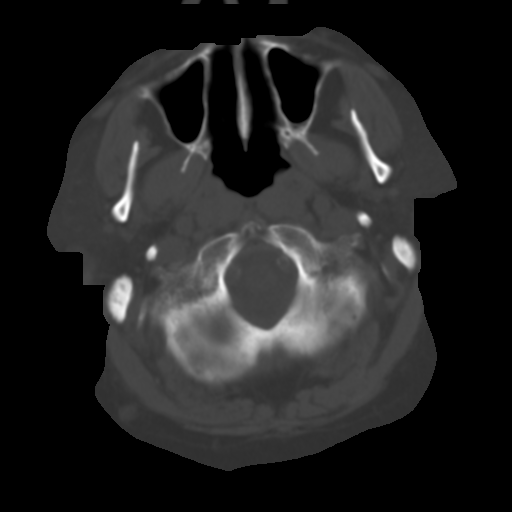
[im 6/34  brain]
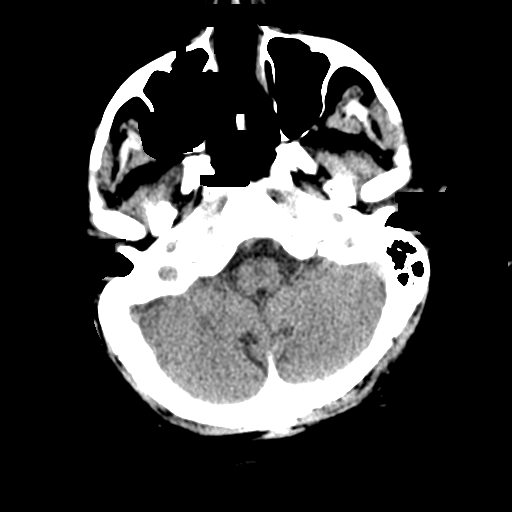
[im 10/34  brain]
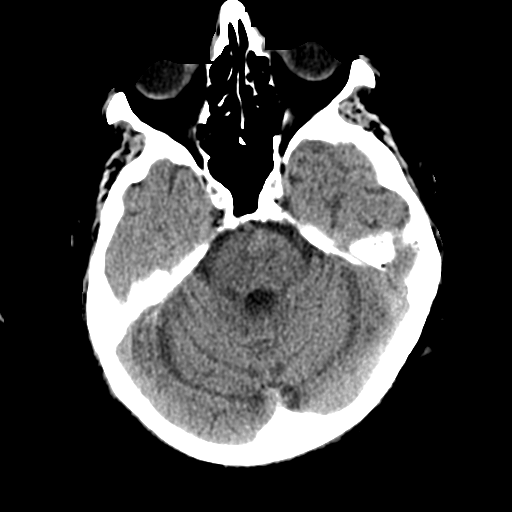
[im 12/34  brain]
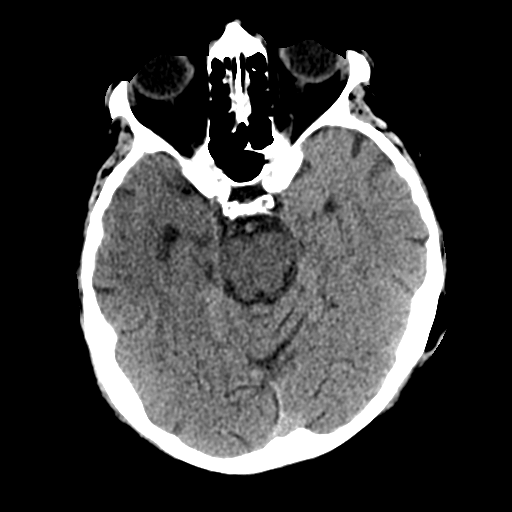
[im 15/34  brain]
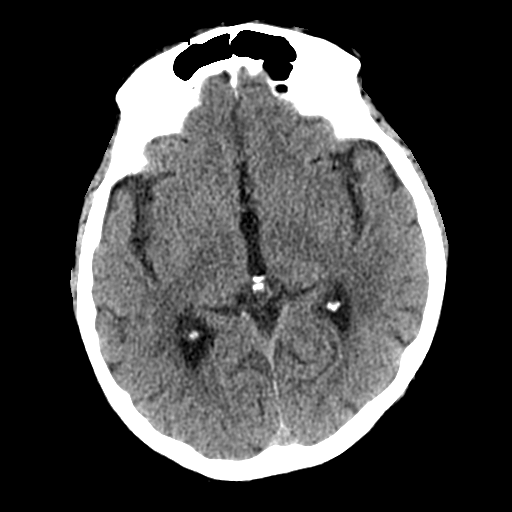
[im 15/34  bone]
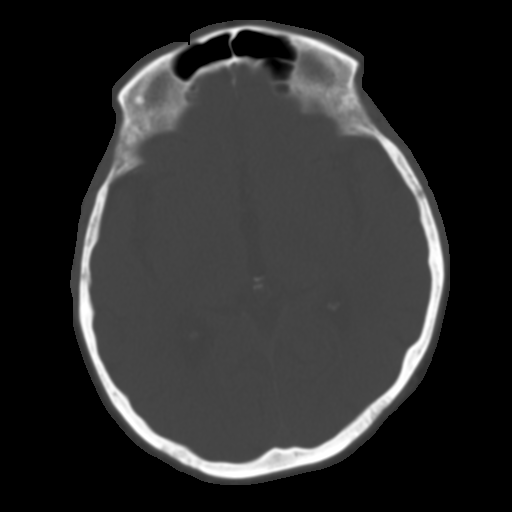
[im 19/34  brain]
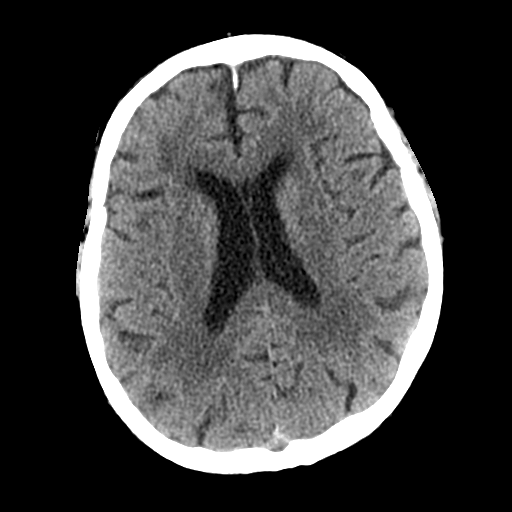
[im 22/34  brain]
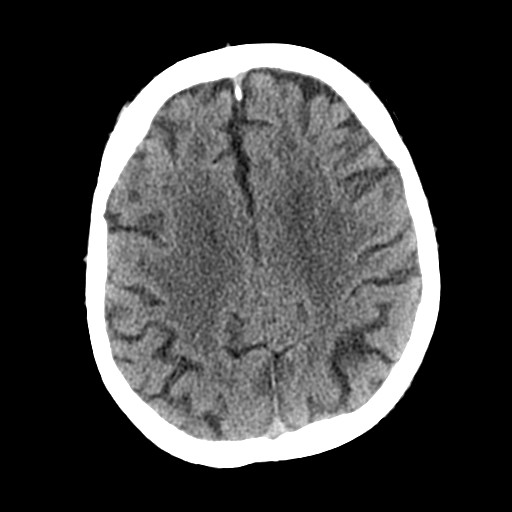
[im 26/34  brain]
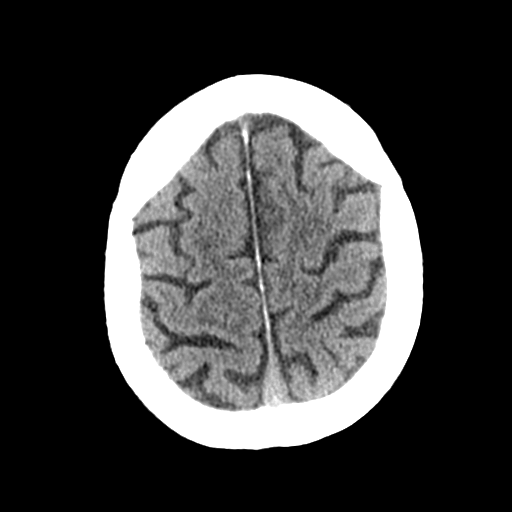
[im 28/34  brain]
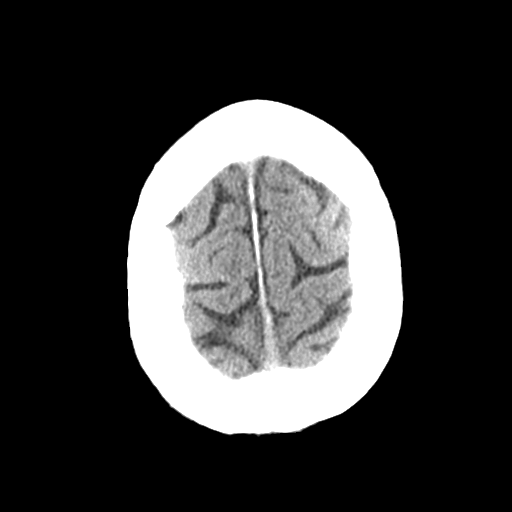
[im 28/34  bone]
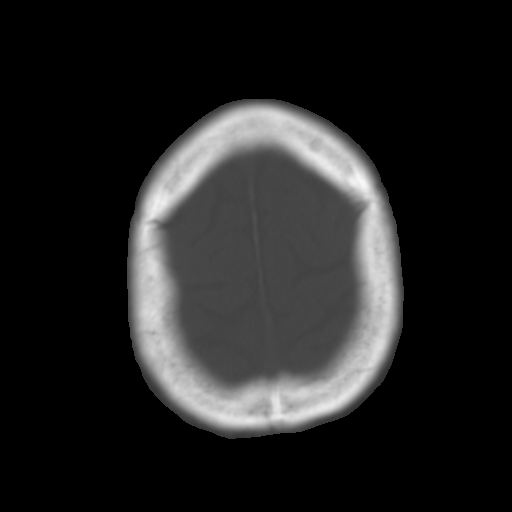
[im 31/34  brain]
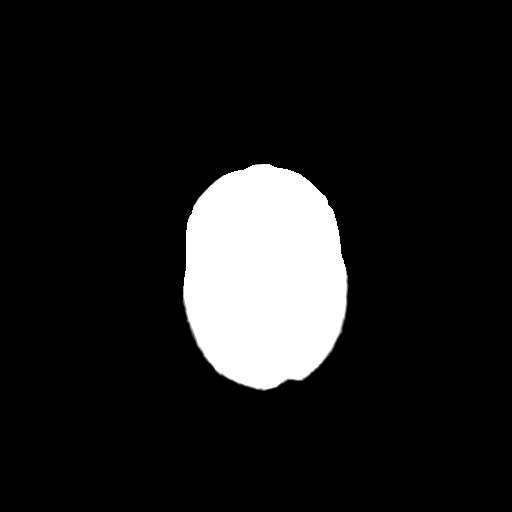

[Series 4: coronal soft · coronal · 0.34mm/px · 3 of 75 slices shown]
[im 25/75  brain]
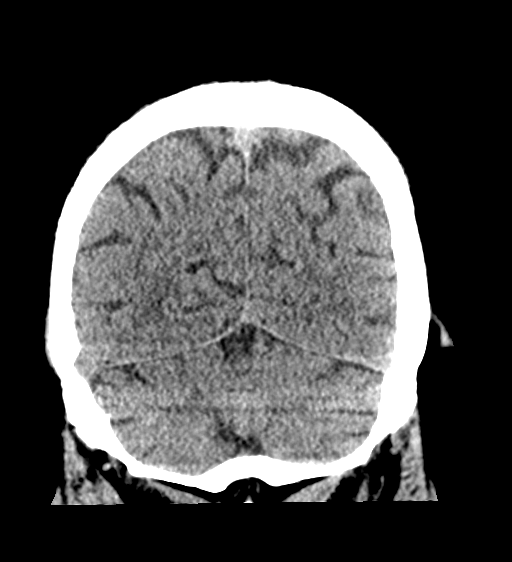
[im 33/75  brain]
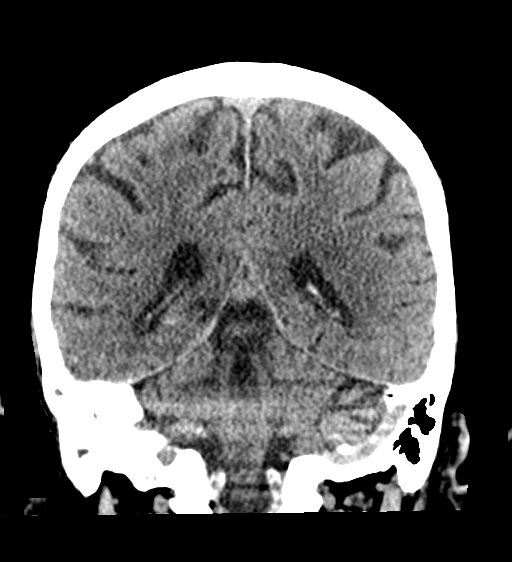
[im 42/75  brain]
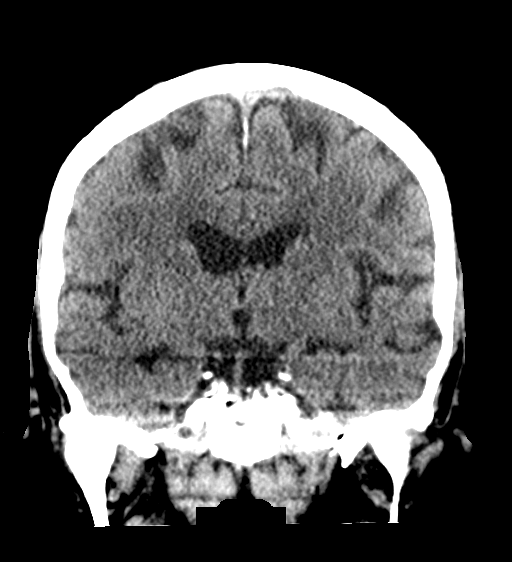

[Series 5: sagittal soft · sagittal · 0.35mm/px · 3 of 62 slices shown]
[im 21/62  brain]
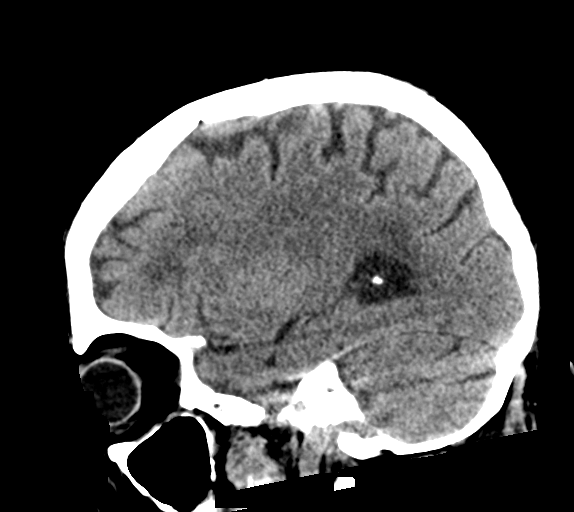
[im 31/62  brain]
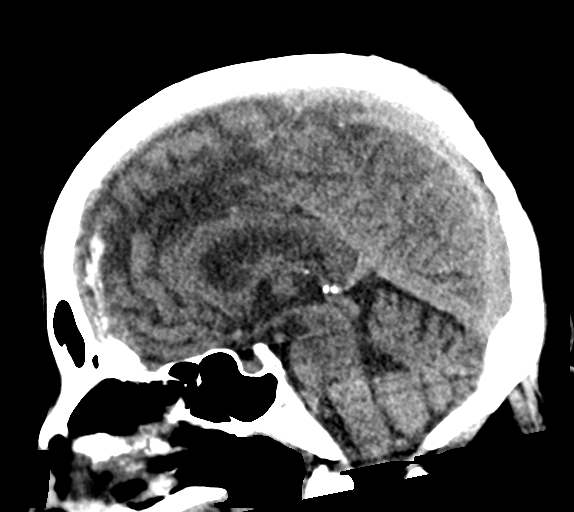
[im 41/62  brain]
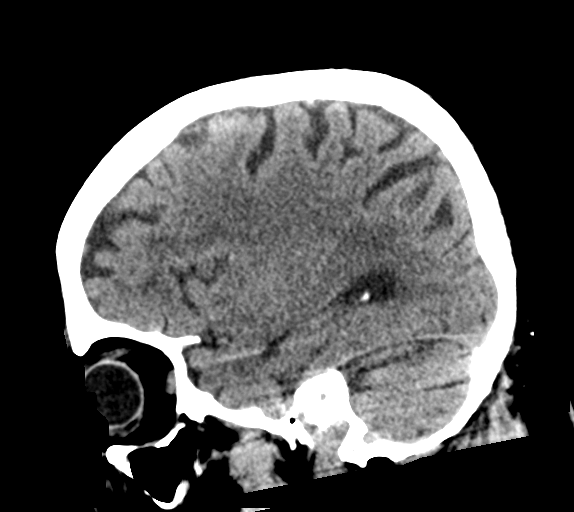

[16 of 47 positions shown; findings below may reference images not displayed]

FINDINGS: Brain: Mild cerebral atrophy. Patchy and confluent areas of
decreased attenuation are noted throughout the deep and
periventricular white matter of the cerebral hemispheres
bilaterally, compatible with chronic microvascular ischemic disease.
No evidence of acute infarction, hemorrhage, hydrocephalus,
extra-axial collection or mass lesion/mass effect.

Vascular: No hyperdense vessel or unexpected calcification.

Skull: Normal. Negative for fracture or focal lesion.

Sinuses/Orbits: No acute finding.

Other: Right middle ear and mastoid effusion.
IMPRESSION: 1. No acute intracranial abnormalities.
2. Right middle ear and mastoid effusion.
3. Mild cerebral atrophy with chronic microvascular ischemic changes
in the cerebral white matter, as above.

## 2021-10-15 IMAGING — DX DG CHEST 1V PORT
1 series · 1 of 1 positions shown · non-contrast
Comparison: March 27, 2021.

CLINICAL DATA: COVID infection and leukocytosis, cough.

EXAM:
PORTABLE CHEST 1 VIEW

[chest ap]
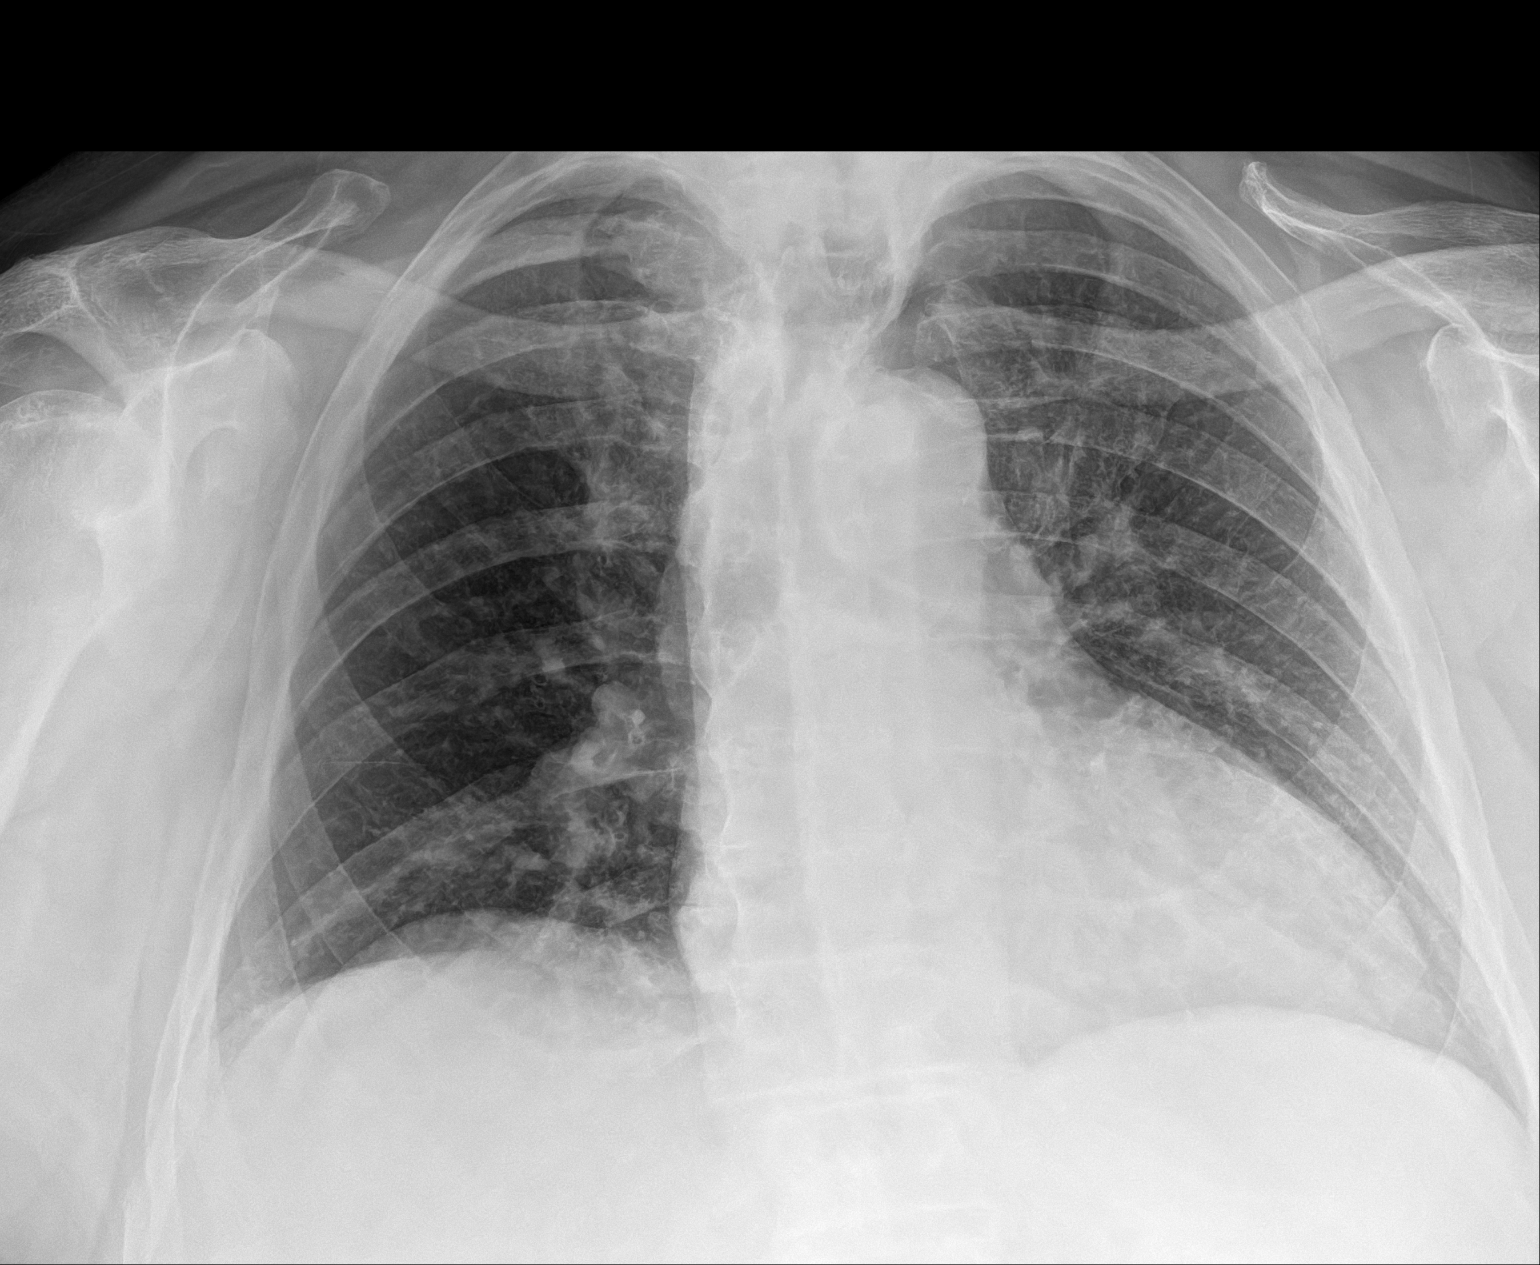

[1 of 1 positions shown; findings below may reference images not displayed]

FINDINGS: Lung volumes are low normal.

Cardiomediastinal contours and hilar structures are stable.

No sign of consolidation or sign of pleural effusion on frontal
radiograph.

On limited assessment no acute skeletal process.
IMPRESSION: No active cardiopulmonary disease.

## 2022-01-23 DIAGNOSIS — E782 Mixed hyperlipidemia: Secondary | ICD-10-CM | POA: Diagnosis not present

## 2022-01-23 DIAGNOSIS — E039 Hypothyroidism, unspecified: Secondary | ICD-10-CM | POA: Diagnosis not present

## 2022-01-23 DIAGNOSIS — E118 Type 2 diabetes mellitus with unspecified complications: Secondary | ICD-10-CM | POA: Diagnosis not present

## 2022-01-29 DIAGNOSIS — E782 Mixed hyperlipidemia: Secondary | ICD-10-CM | POA: Diagnosis not present

## 2022-01-29 DIAGNOSIS — I10 Essential (primary) hypertension: Secondary | ICD-10-CM | POA: Diagnosis not present

## 2022-01-29 DIAGNOSIS — E875 Hyperkalemia: Secondary | ICD-10-CM | POA: Diagnosis not present

## 2022-01-29 DIAGNOSIS — E118 Type 2 diabetes mellitus with unspecified complications: Secondary | ICD-10-CM | POA: Diagnosis not present

## 2022-01-29 DIAGNOSIS — E039 Hypothyroidism, unspecified: Secondary | ICD-10-CM | POA: Diagnosis not present

## 2022-01-29 DIAGNOSIS — L719 Rosacea, unspecified: Secondary | ICD-10-CM | POA: Diagnosis not present

## 2022-01-29 DIAGNOSIS — E871 Hypo-osmolality and hyponatremia: Secondary | ICD-10-CM | POA: Diagnosis not present

## 2022-01-29 DIAGNOSIS — D509 Iron deficiency anemia, unspecified: Secondary | ICD-10-CM | POA: Diagnosis not present

## 2022-02-20 DIAGNOSIS — C44519 Basal cell carcinoma of skin of other part of trunk: Secondary | ICD-10-CM | POA: Diagnosis not present

## 2022-02-20 DIAGNOSIS — Z08 Encounter for follow-up examination after completed treatment for malignant neoplasm: Secondary | ICD-10-CM | POA: Diagnosis not present

## 2022-02-20 DIAGNOSIS — Z85828 Personal history of other malignant neoplasm of skin: Secondary | ICD-10-CM | POA: Diagnosis not present

## 2022-02-20 DIAGNOSIS — X32XXXA Exposure to sunlight, initial encounter: Secondary | ICD-10-CM | POA: Diagnosis not present

## 2022-02-20 DIAGNOSIS — L570 Actinic keratosis: Secondary | ICD-10-CM | POA: Diagnosis not present

## 2022-02-20 DIAGNOSIS — C44612 Basal cell carcinoma of skin of right upper limb, including shoulder: Secondary | ICD-10-CM | POA: Diagnosis not present

## 2022-02-20 DIAGNOSIS — Z1283 Encounter for screening for malignant neoplasm of skin: Secondary | ICD-10-CM | POA: Diagnosis not present

## 2022-03-18 DIAGNOSIS — E113293 Type 2 diabetes mellitus with mild nonproliferative diabetic retinopathy without macular edema, bilateral: Secondary | ICD-10-CM | POA: Diagnosis not present

## 2022-04-24 DIAGNOSIS — L820 Inflamed seborrheic keratosis: Secondary | ICD-10-CM | POA: Diagnosis not present

## 2022-04-24 DIAGNOSIS — Z08 Encounter for follow-up examination after completed treatment for malignant neoplasm: Secondary | ICD-10-CM | POA: Diagnosis not present

## 2022-04-24 DIAGNOSIS — Z85828 Personal history of other malignant neoplasm of skin: Secondary | ICD-10-CM | POA: Diagnosis not present

## 2022-04-24 DIAGNOSIS — L821 Other seborrheic keratosis: Secondary | ICD-10-CM | POA: Diagnosis not present

## 2022-05-28 DIAGNOSIS — E039 Hypothyroidism, unspecified: Secondary | ICD-10-CM | POA: Diagnosis not present

## 2022-05-28 DIAGNOSIS — E782 Mixed hyperlipidemia: Secondary | ICD-10-CM | POA: Diagnosis not present

## 2022-05-28 DIAGNOSIS — E118 Type 2 diabetes mellitus with unspecified complications: Secondary | ICD-10-CM | POA: Diagnosis not present

## 2022-06-02 DIAGNOSIS — E1169 Type 2 diabetes mellitus with other specified complication: Secondary | ICD-10-CM | POA: Diagnosis not present

## 2022-06-02 DIAGNOSIS — Z0001 Encounter for general adult medical examination with abnormal findings: Secondary | ICD-10-CM | POA: Diagnosis not present

## 2022-06-02 DIAGNOSIS — E875 Hyperkalemia: Secondary | ICD-10-CM | POA: Diagnosis not present

## 2022-06-02 DIAGNOSIS — E782 Mixed hyperlipidemia: Secondary | ICD-10-CM | POA: Diagnosis not present

## 2022-06-02 DIAGNOSIS — I10 Essential (primary) hypertension: Secondary | ICD-10-CM | POA: Diagnosis not present

## 2022-06-02 DIAGNOSIS — E039 Hypothyroidism, unspecified: Secondary | ICD-10-CM | POA: Diagnosis not present

## 2022-06-02 DIAGNOSIS — D509 Iron deficiency anemia, unspecified: Secondary | ICD-10-CM | POA: Diagnosis not present

## 2022-06-02 DIAGNOSIS — Z23 Encounter for immunization: Secondary | ICD-10-CM | POA: Diagnosis not present

## 2022-06-02 DIAGNOSIS — E871 Hypo-osmolality and hyponatremia: Secondary | ICD-10-CM | POA: Diagnosis not present

## 2022-06-09 DIAGNOSIS — H01002 Unspecified blepharitis right lower eyelid: Secondary | ICD-10-CM | POA: Diagnosis not present

## 2022-06-09 DIAGNOSIS — E119 Type 2 diabetes mellitus without complications: Secondary | ICD-10-CM | POA: Diagnosis not present

## 2022-06-09 DIAGNOSIS — H01001 Unspecified blepharitis right upper eyelid: Secondary | ICD-10-CM | POA: Diagnosis not present

## 2022-06-09 DIAGNOSIS — H26493 Other secondary cataract, bilateral: Secondary | ICD-10-CM | POA: Diagnosis not present

## 2022-09-24 DIAGNOSIS — E782 Mixed hyperlipidemia: Secondary | ICD-10-CM | POA: Diagnosis not present

## 2022-09-24 DIAGNOSIS — E1169 Type 2 diabetes mellitus with other specified complication: Secondary | ICD-10-CM | POA: Diagnosis not present

## 2022-09-24 DIAGNOSIS — E039 Hypothyroidism, unspecified: Secondary | ICD-10-CM | POA: Diagnosis not present

## 2022-09-24 DIAGNOSIS — Z125 Encounter for screening for malignant neoplasm of prostate: Secondary | ICD-10-CM | POA: Diagnosis not present

## 2022-09-30 DIAGNOSIS — D509 Iron deficiency anemia, unspecified: Secondary | ICD-10-CM | POA: Diagnosis not present

## 2022-09-30 DIAGNOSIS — L719 Rosacea, unspecified: Secondary | ICD-10-CM | POA: Diagnosis not present

## 2022-09-30 DIAGNOSIS — E039 Hypothyroidism, unspecified: Secondary | ICD-10-CM | POA: Diagnosis not present

## 2022-09-30 DIAGNOSIS — E871 Hypo-osmolality and hyponatremia: Secondary | ICD-10-CM | POA: Diagnosis not present

## 2022-09-30 DIAGNOSIS — E782 Mixed hyperlipidemia: Secondary | ICD-10-CM | POA: Diagnosis not present

## 2022-09-30 DIAGNOSIS — I10 Essential (primary) hypertension: Secondary | ICD-10-CM | POA: Diagnosis not present

## 2022-09-30 DIAGNOSIS — E875 Hyperkalemia: Secondary | ICD-10-CM | POA: Diagnosis not present

## 2022-09-30 DIAGNOSIS — E1169 Type 2 diabetes mellitus with other specified complication: Secondary | ICD-10-CM | POA: Diagnosis not present

## 2022-12-29 DIAGNOSIS — Z1283 Encounter for screening for malignant neoplasm of skin: Secondary | ICD-10-CM | POA: Diagnosis not present

## 2022-12-29 DIAGNOSIS — L570 Actinic keratosis: Secondary | ICD-10-CM | POA: Diagnosis not present

## 2022-12-29 DIAGNOSIS — C44319 Basal cell carcinoma of skin of other parts of face: Secondary | ICD-10-CM | POA: Diagnosis not present

## 2022-12-29 DIAGNOSIS — D225 Melanocytic nevi of trunk: Secondary | ICD-10-CM | POA: Diagnosis not present

## 2022-12-29 DIAGNOSIS — D485 Neoplasm of uncertain behavior of skin: Secondary | ICD-10-CM | POA: Diagnosis not present

## 2022-12-29 DIAGNOSIS — X32XXXD Exposure to sunlight, subsequent encounter: Secondary | ICD-10-CM | POA: Diagnosis not present

## 2023-01-28 DIAGNOSIS — E039 Hypothyroidism, unspecified: Secondary | ICD-10-CM | POA: Diagnosis not present

## 2023-01-28 DIAGNOSIS — E782 Mixed hyperlipidemia: Secondary | ICD-10-CM | POA: Diagnosis not present

## 2023-01-28 DIAGNOSIS — E1169 Type 2 diabetes mellitus with other specified complication: Secondary | ICD-10-CM | POA: Diagnosis not present

## 2023-02-04 DIAGNOSIS — D509 Iron deficiency anemia, unspecified: Secondary | ICD-10-CM | POA: Diagnosis not present

## 2023-02-04 DIAGNOSIS — E039 Hypothyroidism, unspecified: Secondary | ICD-10-CM | POA: Diagnosis not present

## 2023-02-04 DIAGNOSIS — E1169 Type 2 diabetes mellitus with other specified complication: Secondary | ICD-10-CM | POA: Diagnosis not present

## 2023-02-04 DIAGNOSIS — I10 Essential (primary) hypertension: Secondary | ICD-10-CM | POA: Diagnosis not present

## 2023-02-04 DIAGNOSIS — E871 Hypo-osmolality and hyponatremia: Secondary | ICD-10-CM | POA: Diagnosis not present

## 2023-02-04 DIAGNOSIS — L719 Rosacea, unspecified: Secondary | ICD-10-CM | POA: Diagnosis not present

## 2023-02-04 DIAGNOSIS — E782 Mixed hyperlipidemia: Secondary | ICD-10-CM | POA: Diagnosis not present

## 2023-02-04 DIAGNOSIS — E875 Hyperkalemia: Secondary | ICD-10-CM | POA: Diagnosis not present

## 2023-02-09 DIAGNOSIS — Z85828 Personal history of other malignant neoplasm of skin: Secondary | ICD-10-CM | POA: Diagnosis not present

## 2023-02-09 DIAGNOSIS — Z08 Encounter for follow-up examination after completed treatment for malignant neoplasm: Secondary | ICD-10-CM | POA: Diagnosis not present

## 2023-02-09 DIAGNOSIS — X32XXXD Exposure to sunlight, subsequent encounter: Secondary | ICD-10-CM | POA: Diagnosis not present

## 2023-02-09 DIAGNOSIS — L570 Actinic keratosis: Secondary | ICD-10-CM | POA: Diagnosis not present

## 2023-03-23 DIAGNOSIS — E113293 Type 2 diabetes mellitus with mild nonproliferative diabetic retinopathy without macular edema, bilateral: Secondary | ICD-10-CM | POA: Diagnosis not present

## 2023-08-03 DIAGNOSIS — E1169 Type 2 diabetes mellitus with other specified complication: Secondary | ICD-10-CM | POA: Diagnosis not present

## 2023-08-03 DIAGNOSIS — E039 Hypothyroidism, unspecified: Secondary | ICD-10-CM | POA: Diagnosis not present

## 2023-08-03 DIAGNOSIS — E782 Mixed hyperlipidemia: Secondary | ICD-10-CM | POA: Diagnosis not present

## 2023-08-07 DIAGNOSIS — E871 Hypo-osmolality and hyponatremia: Secondary | ICD-10-CM | POA: Diagnosis not present

## 2023-08-07 DIAGNOSIS — E875 Hyperkalemia: Secondary | ICD-10-CM | POA: Diagnosis not present

## 2023-08-07 DIAGNOSIS — E039 Hypothyroidism, unspecified: Secondary | ICD-10-CM | POA: Diagnosis not present

## 2023-08-07 DIAGNOSIS — D509 Iron deficiency anemia, unspecified: Secondary | ICD-10-CM | POA: Diagnosis not present

## 2023-08-07 DIAGNOSIS — I10 Essential (primary) hypertension: Secondary | ICD-10-CM | POA: Diagnosis not present

## 2023-08-07 DIAGNOSIS — Z23 Encounter for immunization: Secondary | ICD-10-CM | POA: Diagnosis not present

## 2023-08-07 DIAGNOSIS — E782 Mixed hyperlipidemia: Secondary | ICD-10-CM | POA: Diagnosis not present

## 2023-08-07 DIAGNOSIS — E1169 Type 2 diabetes mellitus with other specified complication: Secondary | ICD-10-CM | POA: Diagnosis not present

## 2023-08-10 DIAGNOSIS — Z1283 Encounter for screening for malignant neoplasm of skin: Secondary | ICD-10-CM | POA: Diagnosis not present

## 2023-08-10 DIAGNOSIS — X32XXXD Exposure to sunlight, subsequent encounter: Secondary | ICD-10-CM | POA: Diagnosis not present

## 2023-08-10 DIAGNOSIS — D225 Melanocytic nevi of trunk: Secondary | ICD-10-CM | POA: Diagnosis not present

## 2023-08-10 DIAGNOSIS — L570 Actinic keratosis: Secondary | ICD-10-CM | POA: Diagnosis not present

## 2023-08-25 DIAGNOSIS — S90112A Contusion of left great toe without damage to nail, initial encounter: Secondary | ICD-10-CM | POA: Diagnosis not present

## 2023-08-25 DIAGNOSIS — X58XXXA Exposure to other specified factors, initial encounter: Secondary | ICD-10-CM | POA: Diagnosis not present

## 2023-08-25 DIAGNOSIS — L609 Nail disorder, unspecified: Secondary | ICD-10-CM | POA: Diagnosis not present

## 2024-01-21 DIAGNOSIS — L570 Actinic keratosis: Secondary | ICD-10-CM | POA: Diagnosis not present

## 2024-01-21 DIAGNOSIS — X32XXXD Exposure to sunlight, subsequent encounter: Secondary | ICD-10-CM | POA: Diagnosis not present

## 2024-01-21 DIAGNOSIS — C44712 Basal cell carcinoma of skin of right lower limb, including hip: Secondary | ICD-10-CM | POA: Diagnosis not present

## 2024-01-23 ENCOUNTER — Encounter (HOSPITAL_COMMUNITY): Payer: Self-pay

## 2024-01-23 ENCOUNTER — Emergency Department (HOSPITAL_COMMUNITY)
Admission: EM | Admit: 2024-01-23 | Discharge: 2024-01-23 | Disposition: A | Discharge status: Home / Self Care | Attending: Emergency Medicine | Admitting: Emergency Medicine

## 2024-01-23 ENCOUNTER — Other Ambulatory Visit: Payer: Self-pay

## 2024-01-23 DIAGNOSIS — T148XXA Other injury of unspecified body region, initial encounter: Secondary | ICD-10-CM

## 2024-01-23 DIAGNOSIS — I10 Essential (primary) hypertension: Secondary | ICD-10-CM | POA: Diagnosis not present

## 2024-01-23 DIAGNOSIS — L7622 Postprocedural hemorrhage and hematoma of skin and subcutaneous tissue following other procedure: Secondary | ICD-10-CM | POA: Diagnosis not present

## 2024-01-23 DIAGNOSIS — E119 Type 2 diabetes mellitus without complications: Secondary | ICD-10-CM | POA: Diagnosis not present

## 2024-01-23 MED ORDER — BACITRACIN ZINC 500 UNIT/GM EX OINT
TOPICAL_OINTMENT | CUTANEOUS | Status: AC
  Filled 2024-01-23: qty 1.8

## 2024-01-23 MED ORDER — BACITRACIN ZINC 500 UNIT/GM EX OINT
TOPICAL_OINTMENT | CUTANEOUS | Status: AC
  Filled 2024-01-23: qty 3.6

## 2024-01-23 NOTE — ED Provider Notes (Signed)
**Henry [name] De-Identified via Obfuscation**  Henry Henry [name]   CSN: [id number] Arrival date & time: 01/23/24  0532     History  Chief Complaint  Patient presents with   Coagulation Disorder    Henry Henry [name] is a 78 y.o. male.  HPI     This is a 78 year old male who presents with bleeding.  Patient reports that he had a biopsy done on Thursday.  He had a lesion on his right lower extremity.  He is not on any blood thinners.  Since that time he has had on and off oozing from the biopsy site.  Noted that he had bleeding tonight when he got up from bed.  He was unable to get it to stop.  Patient states that he is generally fairly active and walks several miles per day.  Has noted more bleeding with activity.  Home Medications Prior to Admission medications   Medication Sig Start Date End Date Taking? Authorizing Provider  cetirizine (ZYRTEC) 10 MG tablet Take 10 mg by mouth daily as needed for allergies.     [provider]  Fish Oil-Cholecalciferol (OMEGA-3 GUMMIES PO) Take 2 tablets by mouth 3 (three) times a week.    [provider]  levothyroxine  (SYNTHROID ) 88 MCG tablet Take 88 mcg by mouth daily. 02/12/21   [provider]  metFORMIN (GLUCOPHAGE) 500 MG tablet Take 500 mg by mouth in the morning and at bedtime.    [provider]  Multiple Vitamins-Minerals (ADULT ONE DAILY GUMMIES PO) Take 2 tablets by mouth daily.    [provider]  simvastatin  (ZOCOR ) 40 MG tablet Take 1 tablet (40 mg total) by mouth daily at 6 PM. 04/05/21   Johnson, Clanford L, MD  telmisartan (MICARDIS) 40 MG tablet Take 40 mg by mouth daily.    [provider]      Allergies    Patient has no known allergies.    Review of Systems   Review of Systems  Skin:        Bleeding from biopsy site, skin wound  All other systems reviewed and are negative.   Physical Exam Updated Vital Signs BP 123/64 (BP Location: Left Arm)   Pulse 82   Temp  98.7 F (37.1 C)   Resp 18   Ht 1.651 m (5\' 5" )   Wt 70.3 kg   SpO2 98%   BMI 25.79 kg/m  Physical Exam Vitals and nursing Henry [name] reviewed.  Constitutional:      Appearance: He is well-developed. He is not ill-appearing.  HENT:     Head: Normocephalic and atraumatic.  Cardiovascular:     Rate and Rhythm: Normal rate and regular rhythm.  Pulmonary:     Effort: Pulmonary effort is normal. No respiratory distress.  Abdominal:     Palpations: Abdomen is soft.  Musculoskeletal:     Cervical back: Neck supple.  Lymphadenopathy:     Cervical: No cervical adenopathy.  Skin:    General: Skin is warm and dry.     Comments: Quarter sized biopsy site noted right lower extremity inferior to the knee.  No active bleeding but there is clot noted, no adjacent erythema  Neurological:     Mental Status: He is alert and oriented to person, place, and time.  Psychiatric:        Mood and Affect: Mood normal.     ED Results / Procedures / Treatments   Labs (all labs ordered are listed, but only abnormal results  are displayed) Labs Reviewed - No data to display  EKG None  Radiology No results found.  Procedures Procedures    Medications Ordered in ED Medications  bacitracin 500 UNIT/GM ointment (  Given 01/23/24 0550)    ED Course/ Medical Decision Making/ A&P                                 Medical Decision Making  This patient presents to the ED for concern of wound bleeding, this involves an extensive number of treatment options, and is a complaint that carries with it a high risk of complications and morbidity.  I considered the following differential and admission for this acute, potentially life threatening condition.  The differential diagnosis includes venous bleed, arterial bleeding  MDM:    This is a 78 year old male who presents with bleeding from his right lower extremity.  He is nontoxic-appearing.  Vital signs are reassuring.  He has a fairly large  well-circumscribed biopsy site on his right lower extremity.  There is clot present without active bleeding.  He is not on any blood thinners.  Antibiotic ointment and compressive nonadherent dressing was applied.  Patient was monitored without recurrence of bleeding.  Recommended that he avoid strenuous activity or long walks for several days to allow the site to fully coagulate and scab.  (Labs, imaging, consults)  Labs: I Ordered, and personally interpreted labs.  The pertinent results include: None  Imaging Studies ordered: I ordered imaging studies including none I independently visualized and interpreted imaging. I agree with the radiologist interpretation  Additional history obtained from chart review.  External records from outside source obtained and reviewed including prior evaluations  Cardiac Monitoring: The patient was not maintained on a cardiac monitor.  If on the cardiac monitor, I personally viewed and interpreted the cardiac monitored which showed an underlying rhythm of: N/A  Reevaluation: After the interventions noted above, I reevaluated the patient and found that they have :improved  Social Determinants of Health:  lives independently  Disposition: Discharge  Co morbidities that complicate the patient evaluation  Past Medical History:  Diagnosis Date   Diabetes mellitus without complication (HCC)    Hypercholesteremia    Hypertension      Medicines Meds ordered this encounter  Medications   bacitracin 500 UNIT/GM ointment    Era Hasty, Kayla: cabinet override    I have reviewed the patients home medicines and have made adjustments as needed  Problem List / ED Course: Problem List Items Addressed This Visit   None Visit Diagnoses       Bleeding from wound    -  Primary                   Final Clinical Impression(s) / ED Diagnoses Final diagnoses:  Bleeding from wound    Rx / DC Orders ED Discharge Orders     None          Rory Collard, MD 01/23/24 [phone]

## 2024-01-23 NOTE — ED Triage Notes (Signed)
 Pt had a biopsy done Thursday 5/1 on right lower leg, has been bleeding on and off since, stated it was bleeding this morning and he couldn't get it to stop. Controlled with pressure. No blood thinners.

## 2024-01-23 NOTE — Discharge Instructions (Addendum)
 Keep wound dressed with antibiotic ointment and nonadherent dressing as well as compression.  Avoid strenuous activity to for the next 2 to 3 days to allow the wound to heal.  Follow-up with Dr. Del Favia.

## 2024-01-29 DIAGNOSIS — E1169 Type 2 diabetes mellitus with other specified complication: Secondary | ICD-10-CM | POA: Diagnosis not present

## 2024-01-29 DIAGNOSIS — E782 Mixed hyperlipidemia: Secondary | ICD-10-CM | POA: Diagnosis not present

## 2024-01-29 DIAGNOSIS — Z125 Encounter for screening for malignant neoplasm of prostate: Secondary | ICD-10-CM | POA: Diagnosis not present

## 2024-01-29 DIAGNOSIS — E039 Hypothyroidism, unspecified: Secondary | ICD-10-CM | POA: Diagnosis not present

## 2024-02-04 DIAGNOSIS — E782 Mixed hyperlipidemia: Secondary | ICD-10-CM | POA: Diagnosis not present

## 2024-02-04 DIAGNOSIS — E871 Hypo-osmolality and hyponatremia: Secondary | ICD-10-CM | POA: Diagnosis not present

## 2024-02-04 DIAGNOSIS — L84 Corns and callosities: Secondary | ICD-10-CM | POA: Diagnosis not present

## 2024-02-04 DIAGNOSIS — E1142 Type 2 diabetes mellitus with diabetic polyneuropathy: Secondary | ICD-10-CM | POA: Diagnosis not present

## 2024-02-04 DIAGNOSIS — D509 Iron deficiency anemia, unspecified: Secondary | ICD-10-CM | POA: Diagnosis not present

## 2024-02-04 DIAGNOSIS — M216X9 Other acquired deformities of unspecified foot: Secondary | ICD-10-CM | POA: Diagnosis not present

## 2024-02-04 DIAGNOSIS — E039 Hypothyroidism, unspecified: Secondary | ICD-10-CM | POA: Diagnosis not present

## 2024-02-04 DIAGNOSIS — R809 Proteinuria, unspecified: Secondary | ICD-10-CM | POA: Diagnosis not present

## 2024-02-04 DIAGNOSIS — E1169 Type 2 diabetes mellitus with other specified complication: Secondary | ICD-10-CM | POA: Diagnosis not present

## 2024-02-04 DIAGNOSIS — E1159 Type 2 diabetes mellitus with other circulatory complications: Secondary | ICD-10-CM | POA: Diagnosis not present

## 2024-02-04 DIAGNOSIS — I10 Essential (primary) hypertension: Secondary | ICD-10-CM | POA: Diagnosis not present

## 2024-02-04 DIAGNOSIS — M79671 Pain in right foot: Secondary | ICD-10-CM | POA: Diagnosis not present

## 2024-02-04 DIAGNOSIS — B351 Tinea unguium: Secondary | ICD-10-CM | POA: Diagnosis not present

## 2024-02-04 DIAGNOSIS — I129 Hypertensive chronic kidney disease with stage 1 through stage 4 chronic kidney disease, or unspecified chronic kidney disease: Secondary | ICD-10-CM | POA: Diagnosis not present

## 2024-02-29 ENCOUNTER — Ambulatory Visit: Admission: EM | Admit: 2024-02-29 | Discharge: 2024-02-29 | Disposition: A | Discharge status: Home / Self Care

## 2024-02-29 DIAGNOSIS — L509 Urticaria, unspecified: Secondary | ICD-10-CM | POA: Diagnosis not present

## 2024-02-29 MED ORDER — DIPHENHYDRAMINE HCL 50 MG PO CAPS
50.0000 mg | ORAL_CAPSULE | Freq: Once | ORAL | Status: AC
  Administered 2024-02-29: 50 mg via ORAL

## 2024-02-29 NOTE — Discharge Instructions (Signed)
 Continue benadryl  every 4 to 6 hours as needed Take zyrtec daily You may add pepcid  if needed, take 20 mg twice a day

## 2024-02-29 NOTE — ED Provider Notes (Signed)
 [facility]    CSN: [id number] Arrival date & time: 02/29/24  1415      History   Chief Complaint No chief complaint on file.   HPI Henry [name] is a 78 y.o. male.   Patient here concerned with rash x 2 hours ago.  Started while at work, got bit on the face by unknown insect.  Denies history of allergies.  Denies swelling of lips/tongue, difficulty breathing, coughing.    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   Hypertension 03/27/2021   Hyponatremia 03/27/2021   Hyperlipidemia 03/27/2021   Type 2 diabetes mellitus (HCC) with noted hyperglycemia 03/27/2021   Elevated CK 03/27/2021   Elevated LFTs 03/27/2021   Acute kidney injury (HCC) 03/27/2021   Urinary retention 03/27/2021   Anemia 03/27/2021   Leukocytosis 03/27/2021   Do not resuscitate 03/27/2021   Constipation 08/02/2020   Rectal bleeding 08/02/2020   History of colonic polyps     Past Surgical History:  Procedure Laterality Date   APPENDECTOMY     COLONOSCOPY N/A 02/25/2018   Procedure: COLONOSCOPY;  Surgeon: Alyce Jubilee, MD;  Location: [facility];  Service: Endoscopy;  Laterality: N/A;  8:30   COLONOSCOPY WITH PROPOFOL  N/A 07/02/2021   Procedure: COLONOSCOPY WITH PROPOFOL ;  Surgeon: Vinetta Greening, DO;  Location: [facility];  Service: Endoscopy;  Laterality: N/A;  8:00am   Left leg surgery     NASAL SEPTUM SURGERY     NASAL SINUS SURGERY     POLYPECTOMY  02/25/2018   Procedure: POLYPECTOMY;  Surgeon: Alyce Jubilee, MD;  Location: [facility];  Service: Endoscopy;;  ascending x2, descending x2, hepatic flexure polyps x2   POLYPECTOMY  07/02/2021   Procedure: POLYPECTOMY;  Surgeon: Vinetta Greening, DO;  Location: [facility];  Service: Endoscopy;;   TONSILLECTOMY          Home Medications    Prior to Admission medications   Medication Sig Start Date End Date Taking? Authorizing Provider  Fish Oil-Cholecalciferol (OMEGA-3 GUMMIES PO) Take 2 tablets by mouth 3 (three) times a week.   Yes [provider]  levothyroxine  (SYNTHROID ) 88 MCG tablet Take 88 mcg by mouth daily. 02/12/21  Yes [provider]  Multiple Vitamins-Minerals (ADULT ONE DAILY GUMMIES PO) Take 2 tablets by mouth daily.   Yes [provider]  simvastatin  (ZOCOR ) 40 MG tablet Take 1 tablet (40 mg total) by mouth daily at 6 PM. 04/05/21  Yes Johnson, Clanford L, MD  SYNJARDY XR 25-1000 MG TB24 Take 1 tablet by mouth daily. 02/26/24  Yes [provider]  telmisartan (MICARDIS) 40 MG tablet Take 40 mg by mouth daily.   Yes [provider]  cetirizine (ZYRTEC) 10 MG tablet Take 10 mg by mouth daily as needed for allergies.     [provider]  metFORMIN (GLUCOPHAGE) 500 MG tablet Take 500 mg by mouth in the morning and at bedtime.    [provider]    Family History Family History  Problem Relation Age of Onset   Colon cancer Neg Hx    Colon polyps Neg Hx     Social History Social History   Tobacco Use   Smoking status: Former    Current packs/day: 0.25  Average packs/day: 0.3 packs/day for 4.0 years (1.0 ttl pk-yrs)    Types: Cigarettes   Smokeless tobacco: Never  Vaping Use   Vaping status: Never Used  Substance Use Topics   Alcohol use: Yes    Alcohol/week: 12.0 standard drinks of alcohol    Types: 12 Cans of beer per week   Drug use: Never     Allergies   Lisinopril   Review of Systems Review of Systems  Constitutional:  Negative for chills, fatigue and fever.  HENT:  Negative for congestion, ear pain, facial swelling, nosebleeds, postnasal drip, rhinorrhea, sinus pressure, sinus pain, sore throat and trouble swallowing.   Eyes:  Negative for pain and redness.  Respiratory:  Negative for cough,  shortness of breath and wheezing.   Gastrointestinal:  Negative for abdominal pain, diarrhea, nausea and vomiting.  Musculoskeletal:  Negative for arthralgias and myalgias.  Skin:  Positive for rash.  Neurological:  Negative for light-headedness and headaches.  Hematological:  Negative for adenopathy. Does not bruise/bleed easily.  Psychiatric/Behavioral:  Negative for confusion and sleep disturbance.      Physical Exam Triage Vital Signs ED Triage Vitals  Encounter Vitals Group     BP 02/29/24 1425 128/67     Systolic BP Percentile --      Diastolic BP Percentile --      Pulse Rate 02/29/24 1425 (!) 112     Resp 02/29/24 1425 18     Temp 02/29/24 1425 98.2 F (36.8 C)     Temp Source 02/29/24 1425 Oral     SpO2 02/29/24 1425 94 %     Weight --      Height --      Head Circumference --      Peak Flow --      Pain Score 02/29/24 1426 0     Pain Loc --      Pain Education --      Exclude from Growth Chart --    No data found.  Updated Vital Signs BP 128/67 (BP Location: Right Arm)   Pulse (!) 112   Temp 98.2 F (36.8 C) (Oral)   Resp 18   SpO2 94%   Visual Acuity Right Eye Distance:   Left Eye Distance:   Bilateral Distance:    Right Eye Near:   Left Eye Near:    Bilateral Near:     Physical Exam Vitals and nursing note reviewed.  Constitutional:      General: He is not in acute distress.    Appearance: Normal appearance. He is not ill-appearing.  HENT:     Head: Normocephalic and atraumatic.     Nose: No congestion or rhinorrhea.     Mouth/Throat:     Pharynx: No oropharyngeal exudate or posterior oropharyngeal erythema.  Eyes:     General: No scleral icterus.    Extraocular Movements: Extraocular movements intact.     Conjunctiva/sclera: Conjunctivae normal.  Cardiovascular:     Rate and Rhythm: Normal rate and regular rhythm.     Heart sounds: No murmur heard. Pulmonary:     Effort: Pulmonary effort is normal. No respiratory distress.     Breath  sounds: Normal breath sounds. No wheezing or rales.  Musculoskeletal:     Cervical back: Normal range of motion. No rigidity.  Lymphadenopathy:     Cervical: No cervical adenopathy.  Skin:    Coloration: Skin is not jaundiced.     Findings: Rash present. Rash is macular, papular and urticarial.  Comments: Salmon colored blanchable macular papular and urticarial lesions b/l arms, legs, chest, back, and face  Neurological:     General: No focal deficit present.     Mental Status: He is alert and oriented to person, place, and time.     Motor: No weakness.     Gait: Gait normal.  Psychiatric:        Mood and Affect: Mood normal.        Behavior: Behavior normal.      UC Treatments / Results  Labs (all labs ordered are listed, but only abnormal results are displayed) Labs Reviewed - No data to display  EKG   Radiology No results found.  Procedures Procedures (including critical care time)  Medications Ordered in UC Medications  diphenhydrAMINE  (BENADRYL ) capsule 50 mg (50 mg Oral Given 02/29/24 1517)    Initial Impression / Assessment and Plan / UC Course  I have reviewed the triage vital signs and the nursing notes.  Pertinent labs & imaging results that were available during my care of the patient were reviewed by me and considered in my medical decision making (see chart for details).     Return or follow up with PCP if no improvement Go to ED if you have swelling of lips/tongue or difficulty breathing or coughing Final Clinical Impressions(s) / UC Diagnoses   Final diagnoses:  Hives     Discharge Instructions      Continue benadryl  every 4 to 6 hours as needed Take zyrtec daily You may add pepcid  if needed, take 20 mg twice a day   ED Prescriptions   None    PDMP not reviewed this encounter.   Lavonia Powers, PA-C 02/29/24 1530

## 2024-02-29 NOTE — ED Triage Notes (Signed)
 Rash all over body that started 2 hours ago, pt state he was bit on his face on left cheek then started to notice the rash. Used benadryl  lotion.

## 2024-03-03 DIAGNOSIS — Z08 Encounter for follow-up examination after completed treatment for malignant neoplasm: Secondary | ICD-10-CM | POA: Diagnosis not present

## 2024-03-03 DIAGNOSIS — Z85828 Personal history of other malignant neoplasm of skin: Secondary | ICD-10-CM | POA: Diagnosis not present

## 2024-03-23 DIAGNOSIS — E113293 Type 2 diabetes mellitus with mild nonproliferative diabetic retinopathy without macular edema, bilateral: Secondary | ICD-10-CM | POA: Diagnosis not present

## 2024-04-14 DIAGNOSIS — E1142 Type 2 diabetes mellitus with diabetic polyneuropathy: Secondary | ICD-10-CM | POA: Diagnosis not present

## 2024-04-14 DIAGNOSIS — M79672 Pain in left foot: Secondary | ICD-10-CM | POA: Diagnosis not present

## 2024-04-14 DIAGNOSIS — L84 Corns and callosities: Secondary | ICD-10-CM | POA: Diagnosis not present

## 2024-04-14 DIAGNOSIS — B351 Tinea unguium: Secondary | ICD-10-CM | POA: Diagnosis not present

## 2024-04-14 DIAGNOSIS — M79671 Pain in right foot: Secondary | ICD-10-CM | POA: Diagnosis not present

## 2024-06-23 DIAGNOSIS — E1142 Type 2 diabetes mellitus with diabetic polyneuropathy: Secondary | ICD-10-CM | POA: Diagnosis not present

## 2024-06-23 DIAGNOSIS — M79671 Pain in right foot: Secondary | ICD-10-CM | POA: Diagnosis not present

## 2024-06-23 DIAGNOSIS — M79672 Pain in left foot: Secondary | ICD-10-CM | POA: Diagnosis not present

## 2024-06-23 DIAGNOSIS — L84 Corns and callosities: Secondary | ICD-10-CM | POA: Diagnosis not present

## 2024-06-23 DIAGNOSIS — B351 Tinea unguium: Secondary | ICD-10-CM | POA: Diagnosis not present

## 2024-07-29 DIAGNOSIS — E1169 Type 2 diabetes mellitus with other specified complication: Secondary | ICD-10-CM | POA: Diagnosis not present

## 2024-07-29 DIAGNOSIS — E782 Mixed hyperlipidemia: Secondary | ICD-10-CM | POA: Diagnosis not present

## 2024-08-05 DIAGNOSIS — E039 Hypothyroidism, unspecified: Secondary | ICD-10-CM | POA: Diagnosis not present

## 2024-08-05 DIAGNOSIS — E871 Hypo-osmolality and hyponatremia: Secondary | ICD-10-CM | POA: Diagnosis not present

## 2024-08-05 DIAGNOSIS — I10 Essential (primary) hypertension: Secondary | ICD-10-CM | POA: Diagnosis not present

## 2024-08-05 DIAGNOSIS — Z0000 Encounter for general adult medical examination without abnormal findings: Secondary | ICD-10-CM | POA: Diagnosis not present

## 2024-08-05 DIAGNOSIS — E782 Mixed hyperlipidemia: Secondary | ICD-10-CM | POA: Diagnosis not present

## 2024-08-05 DIAGNOSIS — D509 Iron deficiency anemia, unspecified: Secondary | ICD-10-CM | POA: Diagnosis not present

## 2024-08-05 DIAGNOSIS — R809 Proteinuria, unspecified: Secondary | ICD-10-CM | POA: Diagnosis not present

## 2024-08-05 DIAGNOSIS — E1169 Type 2 diabetes mellitus with other specified complication: Secondary | ICD-10-CM | POA: Diagnosis not present

## 2024-08-05 DIAGNOSIS — E875 Hyperkalemia: Secondary | ICD-10-CM | POA: Diagnosis not present

## 2024-09-01 DIAGNOSIS — E1142 Type 2 diabetes mellitus with diabetic polyneuropathy: Secondary | ICD-10-CM | POA: Diagnosis not present

## 2024-09-01 DIAGNOSIS — Z85828 Personal history of other malignant neoplasm of skin: Secondary | ICD-10-CM | POA: Diagnosis not present

## 2024-09-01 DIAGNOSIS — L84 Corns and callosities: Secondary | ICD-10-CM | POA: Diagnosis not present

## 2024-09-01 DIAGNOSIS — M79672 Pain in left foot: Secondary | ICD-10-CM | POA: Diagnosis not present

## 2024-09-01 DIAGNOSIS — B351 Tinea unguium: Secondary | ICD-10-CM | POA: Diagnosis not present

## 2024-09-01 DIAGNOSIS — M79671 Pain in right foot: Secondary | ICD-10-CM | POA: Diagnosis not present

## 2024-09-01 DIAGNOSIS — Z08 Encounter for follow-up examination after completed treatment for malignant neoplasm: Secondary | ICD-10-CM | POA: Diagnosis not present
